# Patient Record
Sex: Female | Born: 1950 | Race: Black or African American | Hispanic: No | Smoking: Former smoker
Health system: Southern US, Community
[De-identification: ages and names within clinical notes are randomized; demographics above are authoritative.]

## PROBLEM LIST (undated history)

## (undated) DIAGNOSIS — C801 Malignant (primary) neoplasm, unspecified: Secondary | ICD-10-CM

## (undated) DIAGNOSIS — U071 COVID-19: Secondary | ICD-10-CM

## (undated) DIAGNOSIS — F419 Anxiety disorder, unspecified: Secondary | ICD-10-CM

## (undated) DIAGNOSIS — F845 Asperger's syndrome: Secondary | ICD-10-CM

## (undated) DIAGNOSIS — C541 Malignant neoplasm of endometrium: Secondary | ICD-10-CM

## (undated) DIAGNOSIS — N644 Mastodynia: Secondary | ICD-10-CM

## (undated) DIAGNOSIS — Z780 Asymptomatic menopausal state: Secondary | ICD-10-CM

## (undated) DIAGNOSIS — N6320 Unspecified lump in the left breast, unspecified quadrant: Secondary | ICD-10-CM

## (undated) DIAGNOSIS — Z01818 Encounter for other preprocedural examination: Secondary | ICD-10-CM

## (undated) DIAGNOSIS — Z1231 Encounter for screening mammogram for malignant neoplasm of breast: Secondary | ICD-10-CM

## (undated) DIAGNOSIS — G8918 Other acute postprocedural pain: Secondary | ICD-10-CM

## (undated) HISTORY — PX: ABDOMINAL HYSTERECTOMY: SHX81

## (undated) HISTORY — PX: KNEE SURGERY: SHX244

---

## 2001-12-19 NOTE — ED Provider Notes (Signed)
[facility]                      EMERGENCY DEPARTMENT TREATMENT REPORT   NAME:  [name], [name]   MR #:         BILLING #: [id number]          DOS: 12/19/2001   TIME: 7:00 A   49-71-31   cc:    PCU COPY          Kandy Garrison, M.D.   Primary Physician:   OBSERVATION UNIT   CHIEF COMPLAINT: Chest discomfort.   HISTORY OF PRESENT ILLNESS: This 51 year old female presents with   complaints of left-sided chest discomfort with onset at 64. The patient   states the pain woke her from sleep. She describes the discomfort as chest   tightness. She still complains of mild pressure. She states she has been   belching a lot and she felt the discomfort was related to indigestion.   REVIEW OF SYSTEMS:   CONSTITUTIONAL: No fevers or chills.   ENT: No congestion.   RESPIRATORY: No shortness of breath.   CARDIOVASCULAR: Left-sided chest discomfort.   GI: No abdominal pain, nausea, or vomiting.   GENITOURINARY:  No urinary complaints. All other systems negative.   PAST MEDICAL HISTORY:  Asthma.   SOCIAL HISTORY:  Denies smoking.  The patient occasionally drinks ETOH.   FAMILY HISTORY:   Positive for heart disease in the patient's grandmother.   PHYSICAL EXAMINATION:   CONSTITUTIONAL:  Well -developed female, awake and alert.   VITAL SIGNS:  Temperature 98.8, heart rate 103, respiratory rate 18, blood   pressure 121/80.   HEENT:  Anicteric sclerae. The nasopharynx and oropharynx are clear.   NECK:  Supple, nontender, symmetrical, no masses or JVD, trachea midline,   thyroid not enlarged, nodular, or tender.   RESPIRATORY:  Clear and equal BS.  No respiratory distress, tachypnea, or   accessory muscle use.   CARDIOVASCULAR:  Heart regular, without murmurs, gallops, rubs, or thrills.   PMI not displaced.   CHEST:  Chest symmetrical without masses or tenderness.   ABDOMEN: Soft and nontender to palpation with no rebound, no mass, or   guarding.   EXTREMITIES: No edema.    SKIN:  Warm and dry.   ASSESSMENT/MANAGEMENT PLAN:  Patient with chest pain.  Acute ischemic   coronary disease must be considered first, and the patient protected   against the consequences of same, while other etiologies (including   infectious, metabolic, pulmonary, GI, and musculoskeletal) are considered.   DIAGNOSTIC STUDIES: EKG was sinus tachycardia with no acute ST changes.   Chest x-ray was not acute changes by my reading. CBC with WBC of 5.8,   hemoglobin 11, hematocrit of 33, 276,000 platelets. BMP was normal. CPK   168, MB 1.3, Troponin 0.1, myoglobin 24.   COURSE IN EMERGENCY DEPARTMENT: On reevaluation the patient states the   abdominal discomfort and the pain that had radiated down to it, was feeling   better. However, she was still complaining of left-sided chest discomfort.   A decision for further evaluation through chest pain protocol was made.   The initial Emergency Department evaluation of this patient appears to be   negative for evidence of an acute coronary ischemia requiring hospital   admission or urgent intervention.  However, ischemic coronary disease has   not been eliminated as a consideration.  Consequently, the patient will be   assigned to the Outpatient  Center for serial cardiac enzymes and, if these   are negative, resting and stress echocardiography or other additional   diagnostic testing.   DISPOSITION: Condition on discharge is stable.   FINAL DIAGNOSIS:   Acute chest pain evaluation.   Electronically Signed By:   Haze Justin, M.D. 12/20/2001 06:37   ____________________________   Haze Justin, M.D.   dd  D:  12/19/2001  T:  12/19/2001  7:20 A   [id number]

## 2001-12-19 NOTE — Procedures (Signed)
[facility]                          STRESS ECHOCARDIOGRAM REPORT   NAME:   [name], April                      SS#:   [id number]   DOB:     06/03/1951                                     AGE:        50   SEX:     F                                           ROOM#:     [id number]   MR#:    [id number]                                       DATE:   12/19/2001   REFERRING PHYS:   L. Siegel                                TAPE/INDEX:   211/3143   PRETEST DATA:   INDICATION:  Chest pain   MEDS TAKEN:  None   MEDS HELD:  None   TARGET HEART RATE:  172     85%:  146   RISK FACTORS:  --   BASELINE ECG:  Normal sinus rhythm; within normal limits   EXERCISE SUPERVISED BY:  Andrea Lubeck, RN-FNP   TEST RESULTS:             BRUCE PROTOCOL    STAGE    SPEED (MPH)   GRADE (%)    TIME (MIN:SEC)     HR       BP   Resting                                                  96      92/45      1          1.7           10            3:00         136     148/80      2          2.5           12            3:00         142     156/74      3          3.4           14            0:14         155     ---/--   Recovery                                 Immediate        --     ---/--                                             2:00          99      90/58                                             4:00          93     112/74                                             6:00          94     110/78   REASON FOR STOPPING:  Fatigue; dyspnea; achieved target HR      TOTAL   EXERCISE TIME:  6:14   ACHIEVED HEART RATE:  155 (90%  max HR)                         EST. METS:   7.3   HR RESPONSE:  Tachycardic                                       PEAK RPP:   24,180   BP RESPONSE:  Normal                                            CHEST PAIN:   None   OBSERVED DYSRHYTHMIAS:  PVC   ST SEGMENT CHANGES:  None                                 WALL MOTION ANALYSIS    LV WALL SEGMENT             PRE-EXERCISE             POST-EXERCISE   Basal Anteroseptal             Normal                 Hyperkinesis   Basal Septal                   Normal                 Hyperkinesis   Basal Inferoseptal             Normal                 Hyperkinesis   Basal Posterior                Normal                 Hyperkinesis   Basal Lateral                    Normal                 Hyperkinesis   Basal Anterior                 Normal                 Hyperkinesis   Mid Anteroseptal               Normal                 Hyperkinesis   Mid Septal                     Normal                 Hyperkinesis   Mid Inferoseptal               Normal                 Hyperkinesis   Mid Posterior                  Normal                 Hyperkinesis   Mid Lateral                    Normal                 Hyperkinesis   Mid Anterior                   Normal                 Hyperkinesis   Apical Septal                  Normal                 Hyperkinesis   Apical Inferior                Normal                 Hyperkinesis   Apical Lateral                 Normal                 Hyperkinesis   Apical Anterior                Normal                 Hyperkinesis   LV  Chamber Size                                        Smaller   ECG INTERPRETATION:  Negative by ECG criteria.   ECHO INTERPRETATION:  --   OVERALL IMPRESSION:  Negative for ischemia.   ECG AND ECHO INTERPRETATION BY :                                          WAYNE OLD, M.D.   bes  D: 12/19/2001  T: 12/19/2001  4:45 P    wks

## 2001-12-19 NOTE — Discharge Summary (Signed)
[facility]                       OUTPATIENT CENTER DISCHARGE SUMMARY   [name], [name]   MR#:          BILLING #: [id number]      DOS: 12/19/2001   DOD:12/19/2001 TIM   P   27-25-36   cc:   Primary Physician:   Date and time of admission:  12/19/01 at 0800.   Date and time of discharge:  12/19/01 at 1130.   HISTORY OF PRESENT ILLNESS:  [age]-year-old female with left-sided   chest discomfort presented to the emergency room at 4 a.m.  She had a   tightness and pressure in her chest, and had a lot of belching and   digestion.   COURSE IN THE OBSERVATION UNIT:  The patient maintained normal vital signs   and sinus rhythm.  There was a stress echocardiogram performed that showed   no evidence of reversible ischemia.  The patient developed a headache that   was partially relieved by Tylenol and ate breakfast without difficulty.   PHYSICAL EXAMINATION:  Obese black female in no distress.   VITAL SIGNS:  Blood pressure 104/70, pulse 89, respirations 18, temperature   98.   HEENT:  Normocephalic, atraumatic.   LUNGS:  Breath sounds are clear bilaterally.   HEART:  Regular rate and rhythm without murmur, rub or gallop.   ABDOMEN:  Soft, nontender.   EXTREMITIES:  Equal pulses.   NEUROLOGIC:  Awake, alert and oriented.   FINAL DIAGNOSIS:  Atypical chest pain.  The patient was sent home in stable   condition to follow up with her primary care physician, Dr. Azucena Kuba.   Electronically Signed By:   Selena Batten, M.D. 12/27/2001 18:59   ____________________________   Selena Batten, M.D.   sp  D:  12/19/2001  T:  12/20/2001 12:27 P   [id number]

## 2001-12-19 NOTE — Procedures (Signed)
[facility]                          STRESS ECHOCARDIOGRAM REPORT   NAME:   [name], [name]                      SS#:   [id number]   DOB:     07-07-1950                                     AGE:        50   SEX:     F                                           ROOM#:     [id number]   MR#:    [id number]                                       DATE:   12/19/2001   REFERRING PHYSMarland Kitchen   Sundra Aland                                TAPE/INDEX:   211/3143   PRETEST DATA:   INDICATION:  Chest pain   MEDS TAKEN:  None   MEDS HELD:  None   TARGET HEART RATE:  172     85%:  146   RISK FACTORS:  --   BASELINE ECG:  Normal sinus rhythm; within normal limits   EXERCISE SUPERVISED BY:  Dara Lords, RN-FNP   TEST RESULTS:             BRUCE PROTOCOL    STAGE    SPEED (MPH)   GRADE (%)    TIME (MIN:SEC)     HR       BP   Resting                                                  96      92/45      1          1.7           10            3:00         136     148/80      2          2.5           12            3:00         142     156/74      3          3.4           14            0:14         155     ---/--   Recovery  Immediate        --     ---/--                                             2:00          99      90/58                                             4:00          93     112/74                                             6:00          94     110/78   REASON FOR STOPPING:  Fatigue; dyspnea; achieved target HR      TOTAL   EXERCISE TIME:  6:14   ACHIEVED HEART RATE:  155 (90%  max HR)                         EST. METS:   7.3   HR RESPONSE:  Tachycardic                                       PEAK RPP:   24,180   BP RESPONSE:  Normal                                            CHEST PAIN:   None   OBSERVED DYSRHYTHMIAS:  PVC   ST SEGMENT CHANGES:  None                                 WALL MOTION ANALYSIS    LV WALL SEGMENT             PRE-EXERCISE             POST-EXERCISE   Basal Anteroseptal             Normal                 Hyperkinesis   Basal Septal                   Normal                 Hyperkinesis   Basal Inferoseptal             Normal                 Hyperkinesis   Basal Posterior                Normal                 Hyperkinesis   Basal Lateral  Normal                 Hyperkinesis   Basal Anterior                 Normal                 Hyperkinesis   Mid Anteroseptal               Normal                 Hyperkinesis   Mid Septal                     Normal                 Hyperkinesis   Mid Inferoseptal               Normal                 Hyperkinesis   Mid Posterior                  Normal                 Hyperkinesis   Mid Lateral                    Normal                 Hyperkinesis   Mid Anterior                   Normal                 Hyperkinesis   Apical Septal                  Normal                 Hyperkinesis   Apical Inferior                Normal                 Hyperkinesis   Apical Lateral                 Normal                 Hyperkinesis   Apical Anterior                Normal                 Hyperkinesis   LV  Chamber Size                                        Smaller   ECG INTERPRETATION:  Negative by ECG criteria.   ECHO INTERPRETATION:  --   OVERALL IMPRESSION:  Negative for ischemia.   ECG AND ECHO INTERPRETATION BY :                                          Deniece Portela OLD, M.D.   Ayesha Rumpf  D: 12/19/2001  T: 12/19/2001  4:45 P    wks

## 2003-01-29 NOTE — ED Provider Notes (Signed)
[facility]                      EMERGENCY DEPARTMENT TREATMENT REPORT   NAME:  CALENA, SALEM            PT. LOCATION:     ER  [phone]   MR #:         BILLING #: [id number]          DOS: 01/29/2003   TIME: 4:23 A   56-21-30   cc:    Kandy Garrison, M.D.   Primary Physician:   Kandy Garrison, M.D.   TIME:   0335 hours.   CHIEF COMPLAINT:   Bladder.   HISTORY OF PRESENT ILLNESS:   A 52 year old female who comes to the   emergency department for evaluation of urinary symptoms that have been   increased today.  States she is having a lot of frequency and urgency.  She   has been complaining that her urine seems darker than it normally is.   There is no dark brown or tea color color to it.  Has not seen any bright   red bleeding.  Notes that she has had problems like this before but usually   during her menstrual cycle than for it to happen to today is abnormal for   her.  She denies any dysuria.  Denies any vaginal discharge.  Denies   abdominal pain, nausea or vomiting.  Reports she has been having headaches   on and off but does not have one now.  Her appetite has been normal. She   has had no abnormal bowel habits.   REVIEW OF SYSTEMS:   CONSTITUTIONAL:  No fevers or chills.   EYES: No visual symptoms.   ENT: No sore throat, runny nose or other URI symptoms.   GASTROINTESTINAL:  No vomiting, diarrhea, or abdominal pain.   GENITOURINARY:  As noted in HPI.   MUSCULOSKELETAL:  No joint pain or swelling.   INTEGUMENTARY:  No rashes.   NEUROLOGICAL:  No headaches, sensory or motor symptoms.   PAST MEDICAL HISTORY:  None.   SOCIAL HISTORY: Here alone.   MEDICATIONS:  None.   ALLERGIES:  Penicillin, sulfa.   PHYSICAL EXAMINATION:   GENERAL APPEARANCE:  The patient appears well developed and well nourished.   Appearance   and behavior are age and situation appropriate.   VITAL SIGNS:  Blood pressure 123/84, pulse 115, respirations 20,   temperature 99.3. 3/10 discomfort.    HEENT:   Eyes:  Conjunctivae clear, lids normal.  Pupils equal,   symmetrical, and normally reactive.   Mouth/Throat:  Surfaces of the   pharynx, palate, and tongue are pink, moist, and without lesions.   NECK:  Supple, nontender, symmetrical.   LYMPHATIC:  No cervical or submandibular lymphadenopathy palpated.   RESPIRATORY:  Clear and equal breath sounds.  No respiratory distress,   tachypnea, or accessory muscle use.   CARDIOVASCULAR:   Heart is regular without murmurs, gallops, rubs or   thrills.   GI:  Abdomen soft, nontender, without complaint of pain to palpation.  No   hepatomegaly or splenomegaly.   MUSCULOSKELETAL:   Nail beds pink with prompt capillary refill.   SKIN:  Warm and dry without rashes.   CONTINUATION BY ERIN ICENBICE, PA-C:   INITIAL ASSESSMENT AND MANAGEMENT PLAN:  A 52 year old female with urinary   frequency and urgency.  At this time we will check a urine dip  and blood   glucose.  Urine dip was negative for nitrates and leukocytes and positive   for trace blood.  Blood glucose is 119.   CLINICAL IMPRESSION:  Acute cystitis.   DISPOSITION/PLAN:  The patient discharged home on Cipro 500 mg twice daily   empirically for urinary tract infection.  A urine culture was sent.  She is   to return should she develop abdominal pain, fevers, or other concerns.   Follow up with Dr. Azucena Kuba if not improving.  The patient is discharged home   in stable condition, with instructions to follow up with their regular   doctor.  They are advised to return immediately for any worsening or   symptoms of concern.   Electronically Signed By:   Selena Batten, M.D. 02/05/2003 08:17   ____________________________   Selena Batten, M.D.   gm/dh(1st part)  D:  01/29/2003  T:  01/30/2003  8:44 P   [id number]   ERIN ICENBICE, PA-C

## 2003-02-21 NOTE — ED Provider Notes (Signed)
[facility]                      EMERGENCY DEPARTMENT TREATMENT REPORT   NAME:  [name], [name]            PT. LOCATION:     ER  [phone]   MR #:         BILLING #: [id number]          DOS: 02/21/2003   TIME: 9:15 A   49-71-31   cc:   Primary Physician:   CHIEF COMPLAINT:   Headache.   HISTORY OF PRESENT ILLNESS:  This 52 year old female states she woke up   with a severe headache this morning.  She reports that she checked her   blood pressure and it was elevated with elevated diastolic pressures   consistently.  She denies previous history of hypertension, however, the   blood pressure readings were erratic with systolic blood pressures that   were minimally over the diastolic pressure, such as 120/110.  The patient   describes it as a pressure sensation, diffusely located throughout her   head.  Also complaining of neck tightness on her left side.  Denies any   posterior neck stiffness.  The patient states this is the "Worst headache   of her life."  The patient complaining of nausea.  Denies any vomiting.   Denies any fevers or chills.  Denies any visual difficulty.   REVIEW OF SYSTEMS:   CONSTITUTIONAL:  No fever, chills, weight loss.   EYES: No visual symptoms.   ENT: No sore throat, runny nose or other URI symptoms.   RESPIRATORY:  No cough, shortness of breath, or wheezing.   CARDIOVASCULAR:  No chest pain, chest pressure, or palpitations.   GI:  Positive nausea, no vomiting.   NEUROLOGICAL:  Severe headache.   All other systems negative.   PAST MEDICAL HISTORY:  Negative.   MEDICATIONS:  No current medications.   ALLERGIES:  Sulfa, aspirin, Penicillin.   SOCIAL HISTORY: Denies smoking.  The patient occasionally drinks ETOH.   PHYSICAL EXAMINATION:   GENERAL APPEARANCE: Well-developed female, awake, alert.   VITAL SIGNS: Temperature 98.3, heart rate 118, respirations 20, blood   pressure 140/88.   HEENT:  Anicteric sclera.  Pupils PERRL.  Extraocular movements are  intact.   Fundi benign.  Nasopharynx unremarkable.  Mouth/Throat:  Surfaces of the   pharynx, palate, and tongue are pink, moist, and without lesions.   NECK:  Supple, nontender, symmetrical, no masses or JVD, trachea midline,   thyroid not enlarged, nodular, or tender. Tenderness left, lateral,   cervical musculature.   RESPIRATORY:  Clear and equal breath sounds.  No respiratory distress,   tachypnea, or accessory muscle use.   CARDIOVASCULAR:  Heart regular, without murmurs, gallops, rubs, or thrills.   PMI not displaced.   CHEST:  Chest symmetrical without masses or tenderness.   ABDOMEN:  Soft, nontender.   EXTREMITIES:  Unremarkable.   SKIN:  Warm and dry.   NEUROLOGICAL:  Alert, oriented, grossly nonfocal examination.  Cranial   nerves, deep tendon reflexes, strength, and light touch sensation are   unremarkable.   IMPRESSION AND MANAGEMENT PLAN:  Acute, severe cephalgia.  The patient   stating this is the worst headache of her life, however, headache is   improving at the time of evaluation in the emergency department.  Will   obtain head CT to rule out intracranial abnormality.   CONTINUATION BY DR.  SIEGEL:   The patient observed in the emergency department, she states her headache   is completely resolved.   DIAGNOSTIC STUDIES OBTAINED:  CT scan of the head negative per radiology.   On reevaluation after the head CT the patient still states that her   headache is completely resolved.  She does complain of some muscle   tightness in the left lateral neck and trapezius musculature.   PLAN:  Fioricet and Robaxin as needed.  Return if worsening symptoms or   further problems.  Follow up with Dr. Forestine Na in 1 or 2 days for   recheck and return if any worsening.   CONDITION ON DISCHARGE:  Stable.   FINAL DIAGNOSIS:  Acute severe cephalgia, resolved.   Electronically Signed By:   Haze Justin, M.D. 02/24/2003 07:39   ____________________________   Haze Justin, M.D.    sl/jh  D:  02/21/2003  T:  02/22/2003  8:35 A   [id number]

## 2003-12-29 NOTE — ED Provider Notes (Signed)
[facility]                      EMERGENCY DEPARTMENT TREATMENT REPORT   OBSERVATION UNIT   NAME:  [name]            PT. LOCATION:     ER  [phone]   MR #:         BILLING #: [id number]          DOS: 12/29/2003   TIME: 8:22 P   56-21-30   cc:    Kandy Garrison, M.D.   Primary Physician:   Kandy Garrison, M.D.   The patient was evaluated at 2012 hours.   CHIEF COMPLAINT:   Chest pressure, neck pain.   HISTORY OF PRESENT ILLNESS:  The patient is a 53 year old female who   presents with discomfort in the left neck and a pressure discomfort across   her chest all day today.  It did seem to worsen if she got up and went up   and down some steps as she became more fatigued than normal.  She denies   shortness of breath.  She has had no nausea or vomiting but did have some   diaphoresis which is unusual for her.  She had a similar episode 06-03 and   at that time came here for a stress echocardiogram which apparently was   read as negative for ischemia.  She states she has been under a lot of   stress recently but no other illnesses.  She rates the pressure at 5/10.   REVIEW OF SYSTEMS:   CONSTITUTIONAL:  No fever, chills, weight loss.   EYES: No visual symptoms.   ENT: No sore throat, runny nose or other URI symptoms.   ALLERGIC/IMMUNOLOGIC:  No urticaria or allergy symptoms.   RESPIRATORY:  No cough, shortness of breath, or wheezing.   CARDIOVASCULAR:  Chest pressure today, worsened with exertion.  She has had   some diaphoresis but no dizziness, shortness of breath, nausea or vomiting.   GASTROINTESTINAL:  No vomiting, diarrhea, or abdominal pain.   GENITOURINARY:  No dysuria, frequency, or urgency.   Denies complaints in any other system.   PAST MEDICAL HISTORY:   No chronic illnesses.   SOCIAL HISTORY:  Nonsmoker.   FAMILY HISTORY:   Maternal grandmother had diabetes and heart problems and   her mother had lung problems and heart problems.    ALLERGIES:  Sulfa, Penicillin, aspirin.   MEDICATIONS:  None.   PHYSICAL EXAMINATION:   VITAL SIGNS:  Blood pressure 142/87, pulse 119, respirations 20,   temperature 100.5.  O2 saturation 100% on room air.   GENERAL APPEARANCE:  The patient appears well developed and well nourished.   Appearance and behavior are age and situation appropriate.   HEENT:   Mouth/Throat:  Surfaces of the pharynx, palate, and tongue are   pink, moist, and without lesions.   NECK:  Supple, nontender, symmetrical, no masses or JVD, trachea midline,   thyroid not enlarged, nodular, or tender.   RESPIRATORY:  Clear and equal breath sounds.  No respiratory distress,   tachypnea, or accessory muscle use.   CARDIOVASCULAR:  Rapid, regular.  No MGR.   GI:  Abdomen soft, nontender, without complaint of pain to palpation.  No   hepatomegaly or splenomegaly.   NEUROLOGICAL:  She is awake, alert, aware of surroundings.  Shows no focal   deficits.   LOWER EXTREMITIES:   Calf  areas are soft and nontender.  No edema noted.   CONTINUATION BY DR. Henrene Hawking:   IMPRESSION:  A patient with chest pain.  Acute ischemic coronary disease   must be considered first, and the patient protected against the   consequences of same, while other etiologies (including infectious,   metabolic, pulmonary, gastrointestinal, and musculoskeletal) are   considered.   DIAGNOSTIC TESTING:  CBC normal except for mild anemia with hemoglobin of   10, hematocrit of 32, and 349,000 platelets.  CMP was unremarkable.  Lipase   was normal.  Cardiac enzymes were normal except for total CPK is elevated   at 234, but MB is normal.  Urine dip is negative x 3.   COURSE IN THE EMERGENCY DEPARTMENT:  Discussed further evaluation with the   patient, and she is agreeable for further evaluation through chest pain   protocol.   PLAN:   The initial emergency department evaluation of this patient appears   to be negative for evidence of an acute coronary ischemia requiring    hospital admission or urgent intervention.  However, ischemic coronary   disease has not been eliminated as a consideration.  Consequently, the   patient will be assigned to the Emergency Department Observation Unit for   serial cardiac enzymes and, if these are negative, resting and stress   echocardiography or other additional diagnostic testing.   CONDITION ON ASSIGNMENT TO ED OBSERVATION UNIT:  Stable.   FINAL DIAGNOSIS:  Acute precordial pain.   Electronically Signed By:   Haze Justin, M.D. 01/12/2004 23:11   ____________________________   Haze Justin, M.D.   jj/gm   D:  12/29/2003  T:  12/29/2003  9:01 P   [id number](edit)   Claris Pong, PA

## 2003-12-30 NOTE — Procedures (Signed)
[facility]                          STRESS ECHOCARDIOGRAM REPORT   NAME:   [name], [name]                     SS#:   [id number]   DOB:     1951/05/27                                     AGE:        53   SEX:     F                                           ROOM#:     [id number]   MR#:    [id number]                                       DATE:   12/30/2003   REFERRING PHYSSundra Aland                                TAPE/INDEX:   285/2791   PRETEST DATA:   INDICATION:  Chest pain   MEDS TAKEN:  None   MEDS HELD:  None   TARGET HEART RATE:  169     85%:  143   RISK FACTORS:  Ex-smoker   BASELINE ECG:  --   EXERCISE SUPERVISED BY:  --   TEST RESULTS:             BRUCE PROTOCOL    STAGE    SPEED (MPH)   GRADE (%)    TIME (MIN:SEC)     HR       BP   Resting                                                 100     115/79      1          1.7           10            3:00         131     128/80      2          2.5           12            1:30         151     ---/--   Recovery                               Immediate       151     ---/--  2:00         108     111/71                                             4:00          96     117/72                                             6:00          96     106/74   REASON FOR STOPPING: Achieved target heart rate              TOTAL EXERCISE   TIME:  4:30   ACHIEVED HEART RATE:  151 (89%  max HR)                      EST. METS:   --   HR RESPONSE:  Tachycardic                                    PEAK RPP:   19,328   BP RESPONSE:  Normal                                         CHEST PAIN:   None   OBSERVED DYSRHYTHMIAS:  --   ST SEGMENT CHANGES:  --                                 WALL MOTION ANALYSIS   LV WALL SEGMENT             PRE-EXERCISE             POST-EXERCISE   Basal Anteroseptal             Normal                 Hyperkinesis    Basal Septal                   Normal                 Hyperkinesis   Basal Inferoseptal             Normal                 Hyperkinesis   Basal Posterior                Normal                 Hyperkinesis   Basal Lateral                  Normal                 Hyperkinesis   Basal Anterior                 Normal                 Hyperkinesis  Mid Anteroseptal               Normal                 Hyperkinesis   Mid Septal                     Normal                 Hyperkinesis   Mid Inferoseptal               Normal                 Hyperkinesis   Mid Posterior                  Normal                 Hyperkinesis   Mid Lateral                    Normal                 Hyperkinesis   Mid Anterior                   Normal                 Hyperkinesis   Apical Septal                  Normal                 Hyperkinesis   Apical Inferior                Normal                 Hyperkinesis   Apical Lateral                 Normal                 Hyperkinesis   Apical Anterior                Normal                 Hyperkinesis   LV  Chamber Size                                        Smaller   ECG INTERPRETATION:  Negative by ECG criteria.   ECHO INTERPRETATION:  Normal.   OVERALL IMPRESSION:  Negative for ischemia.  Low likelihood for coronary   artery disease.   ECG AND ECHO INTERPRETATION BY :                                          RON MCKECHNIE, M.D.   [name]  D: 12/30/2003  T: 12/31/2003  6:15 P    wks

## 2003-12-30 NOTE — Discharge Summary (Signed)
[facility]                       OUTPATIENT CENTER DISCHARGE SUMMARY   [name], [name]   MR#:          BILLING #: [id number]      DOS: 12/29/2003   DOD:12/30/2003 TIM   91-47-82   cc:    Docia Furl, M.D.          Kandy Garrison, M.D.   Primary Physician:   Date and Time of Assignment:  12/29/03 at 2230.   Date and Time of Disposition:  12/30/03 at 1030.   ASSIGNMENT DIAGNOSIS:  Acute precordial pain.   DISPOSITION DIAGNOSES:      1. Precordial pain, cardiac unlikely.      2. History of headaches.   HISTORY:  This is a 53 year old female.  The patient had complained of   chest pressure since early that morning with left-sided neck pain.  The   Emergency Department evaluation was unrevealing and the patient was   assigned to the outpatient center for serial cardiac enzymes and stress   echocardiography.   COURSE IN THE OBSERVATION UNIT:  The patient's vital signs remained stable.   She denied continued pain. The patient was assigned to the Emergency   Department Observation Unit for serial cardiac enzymes and resting and   stress echocardiography.  Results of serum myoglobin at 0 and 3 hours, and   serum CPK and MB at 0, 6, and 9 hours gave no indication of acute   myocardial damage.  The patient then underwent resting and exercise 2-D   echocardiography and exercise test under the supervision of [facility].  Their final impression was of "No CAD, OK for Discharge."   EXAMINATION AT DISPOSITION: CARDIOVASCULAR:  S1 and S2 are heard with a   regular rate and rhythm without murmurs, gallops, or rubs.   LUNGS:  Clear to auscultation bilaterally.   ABDOMEN:  Remains soft at disposition.   The patient was instructed to follow up with her primary care physician.  I   told her to return to the emergency room immediately if she developed any   chest pain, shortness of breath, left-sided arm weakness, jaw pain, nausea,    vomiting, or diaphoresis.  The probabilistic nature of cardiac   diagnostic testing was explained.  The patient was counseled to seek   further cardiac evaluation should symptoms worsen or persist without   another diagnosis being found.   Electronically Signed By:   Truitt Merle, M.D. 12/31/2003 06:49   ____________________________   Truitt Merle, M.D.   Mauri Reading  D:  12/30/2003  T:  12/30/2003  4:11 P   [id number]   Penelope Coop, PA-C

## 2003-12-30 NOTE — Procedures (Signed)
[facility]                          STRESS ECHOCARDIOGRAM REPORT   NAME:   [name], [name]                     SS#:   [id number]   DOB:     09/15/1950                                     AGE:        53   SEX:     F                                           ROOM#:     [id number]   MR#:    [id number]                                       DATE:   12/30/2003   REFERRING PHYS:   L. Siegel                                TAPE/INDEX:   285/2791   PRETEST DATA:   INDICATION:  Chest pain   MEDS TAKEN:  None   MEDS HELD:  None   TARGET HEART RATE:  169     85%:  143   RISK FACTORS:  Ex-smoker   BASELINE ECG:  --   EXERCISE SUPERVISED BY:  --   TEST RESULTS:             BRUCE PROTOCOL    STAGE    SPEED (MPH)   GRADE (%)    TIME (MIN:SEC)     HR       BP   Resting                                                 100     115/79      1          1.7           10            3:00         131     128/80      2          2.5           12            1:30         151     ---/--   Recovery                               Immediate       151     ---/--                                               2:00         108     111/71                                             4:00          96     117/72                                             6:00          96     106/74   REASON FOR STOPPING: Achieved target heart rate              TOTAL EXERCISE   TIME:  4:30   ACHIEVED HEART RATE:  151 (89%  max HR)                      EST. METS:   --   HR RESPONSE:  Tachycardic                                    PEAK RPP:   19,328   BP RESPONSE:  Normal                                         CHEST PAIN:   None   OBSERVED DYSRHYTHMIAS:  --   ST SEGMENT CHANGES:  --                                 WALL MOTION ANALYSIS   LV WALL SEGMENT             PRE-EXERCISE             POST-EXERCISE   Basal Anteroseptal             Normal                 Hyperkinesis    Basal Septal                   Normal                 Hyperkinesis   Basal Inferoseptal             Normal                 Hyperkinesis   Basal Posterior                Normal                 Hyperkinesis   Basal Lateral                  Normal                 Hyperkinesis   Basal Anterior                 Normal                 Hyperkinesis     Mid Anteroseptal               Normal                 Hyperkinesis   Mid Septal                     Normal                 Hyperkinesis   Mid Inferoseptal               Normal                 Hyperkinesis   Mid Posterior                  Normal                 Hyperkinesis   Mid Lateral                    Normal                 Hyperkinesis   Mid Anterior                   Normal                 Hyperkinesis   Apical Septal                  Normal                 Hyperkinesis   Apical Inferior                Normal                 Hyperkinesis   Apical Lateral                 Normal                 Hyperkinesis   Apical Anterior                Normal                 Hyperkinesis   LV  Chamber Size                                        Smaller   ECG INTERPRETATION:  Negative by ECG criteria.   ECHO INTERPRETATION:  Normal.   OVERALL IMPRESSION:  Negative for ischemia.  Low likelihood for coronary   artery disease.   ECG AND ECHO INTERPRETATION BY :                                          RON MCKECHNIE, M.D.   bes  D: 12/30/2003  T: 12/31/2003  6:15 P    wks

## 2004-01-01 NOTE — ED Provider Notes (Signed)
[facility]                      EMERGENCY DEPARTMENT TREATMENT REPORT   NAME:  ELINORE, SHULTS            PT. LOCATION:     ER  [phone]   MR #:         BILLING #: [id number]          DOS: 01/01/2004   TIME: 4:41 A   [id number]   cc:   Primary Physician:   Time seen:  0441 hours.   CHIEF COMPLAINT:  Upper abdominal pain, extremity tingling and numbness.   HISTORY OF PRESENT ILLNESS;  This is a 53 year old female who has had   epigastric pain associated with burping tonight.  Also she has had tingling   in her left arm and heaviness in her left leg that has since improved.  The   patient has been under a lot of stress at home.  She was seen here for   chest pain protocol on 12/29/03 through 12/30/03 of this year.  Stress test was   negative.   REVIEW OF SYSTEMS:   GI:  As noted.   NEUROLOGICAL:  As noted.   PSYCHIATRIC:  As noted.   Denies complaints in any other system.   PAST MEDICAL HISTORY:  Negative except for recent chest pain evaluation and   normal stress test.   SOCIAL HISTORY: The patient is a nonsmoker.  Denies alcohol or drug abuse.   FAMILY HISTORY:  Remarkable for diabetes mellitus.   PHYSICAL EXAMINATION:  The patient is alert and oriented x 3, cooperative.   VITAL SIGNS:  Blood pressure 127/82, pulse 102, respirations 20,   temperature 98.7, O2 saturation 100% on room air.   HEENT:  Eyes:  Conjunctivae clear, lids normal.  Pupils equal, symmetrical,   and normally reactive.    Mouth/Throat:  Surfaces of the pharynx, palate,   and tongue are pink, moist, and without lesions.   NECK:  Supple, nontender, symmetrical, no masses or JVD, trachea midline,   thyroid not enlarged, nodular, or tender.   RESPIRATORY:  Clear and equal breath sounds.  No respiratory distress,   tachypnea, or accessory muscle use.   GI:  Abdomen soft, nontender, without complaint of pain to palpation.  No   hepatomegaly or splenomegaly.    MUSCULOSKELETAL:  Nails:  No clubbing or deformities.  Nail beds pink with   prompt capillary refill.   SKIN:  Warm and dry without rashes.   NEUROLOGIC:  Cranial nerves, deep tendon reflexes, strength, and light   touch sensation are unremarkable.  Cerebellar function is intact with good   finger to nose.   INITIAL ASSESSMENT AND MANAGEMENT PLAN:  This is a 53 year old female with   abdominal discomfort and gassiness as well as peripheral tingling.  No   focal findings on neurological examination.   DIAGNOSTIC EVALUATION:  I-stat was remarkable for anemia, hemoglobin and   hematocrit of 10.9 and 32 respectively.  Potassium was low at 3.2.  The   rest of the electrolytes were unremarkable.   COURSE IN THE EMERGENCY DEPARTMENT:  The patient was given a GI cocktail   with improvement in her symptoms.  She was subsequently discharged home   with instructions to use Gas-X.  She was given a prescription for potassium   to be taken as directed.  She is to be reevaluated for worsening of   symptoms.  Otherwise follow up with primary care physician.   FINAL DIAGNOSES:      1. Acute epigastric abdominal pain evaluation.      2. Acute hypokalemia.      3. Anemia.   DISPOSITION:  The patient was discharged home in stable and improved   condition.   Electronically Signed By:   Erlinda Hong, M.D. 01/12/2004 17:03   ____________________________   TODD Wilfrid Lund, M.D.   sp  D:  01/01/2004  T:  01/02/2004  2:37 P   [id number]

## 2004-11-11 NOTE — ED Provider Notes (Signed)
[facility] [place]                      EMERGENCY DEPARTMENT TREATMENT REPORT   NAME:  [name], [name]            PT. LOCATION:     ER  ZO10   MR #:         BILLING #: [id number]          DOS: 11/11/2004   TIME: 3:34 A   [id number]   cc:   Primary Physician:   None   CHIEF COMPLAINT:  Dental pain.   HISTORY OF PRESENT ILLNESS:  A 54 year old female presents with pain.  All   of her teeth are bad, and she is actually supposed to be seeing her dentist   tomorrow for pulling of the teeth, but she states that her teeth are   hurting worse than usual and she is worried they are infected.  Her gums   are a little swollen.  She denies fevers or chills.   REVIEW OF SYSTEMS:   CONSTITUTIONAL:  No fevers or chills.   ENT:  Dental pain.   PAST MEDICAL HISTORY:  Unremarkable.   SOCIAL HISTORY:  Here with son.   ALLERGIES:  penicillin and aspirin.   MEDICATIONS:  Tylenol #3.   PHYSICAL EXAMINATION:   VITAL SIGNS:  Blood pressure 124/80, pulse 103, respirations 20.   Temperature 98.8.  Oxygen saturation 100%.  Pain is 10 out of 10.   GENERAL APPEARANCE:  The patient appears well developed and well nourished.   Appearance and behavior are age and situation appropriate.   Eyes:  Conjunctivae clear, lids normal.  Pupils equal, symmetrical, and   normally reactive.   ENT:  The patient has multiple dental caries, multiple areas of breakdown   of the teeth where the teeth have turned black.  In her right upper gum   line she has some swelling of the gums.  Diffuse tenderness across all   remaining teeth.  No evidence of an abscess.   NECK:  Supple, symmetrical.  Trachea midline.   LYMPHATIC:  No cervical or submandibular lymphadenopathy palpated.   INITIAL ASSESSMENT AND MANAGEMENT PLAN:  A [age]-year-old female   presents with multiple dental caries and dental pain.  The patient would   like a prescription for antibiotics.  She has taken clindamycin in the past    without difficulty.  As she is penicillin allergic, we will prescribe her   clindamycin and she will continue Tylenol and ibuprofen.   DIAGNOSIS:  Facial pain secondary to dental caries.   PLAN:   1.  The patient is discharged home in stable condition, with instructions   to follow up with their regular doctor.  They are advised to return   immediately for any worsening or symptoms of concern.  The patient is to   keep her appointment with her dentist for later today.   2.  The patient is written prescription for clindamycin #40.  She is   advised to swish warm salt water a couple of times daily.  She may return   if fever, new or worsening symptoms.  Continue Tylenol and ibuprofen for   pain.   Electronically Signed By:   Lucita Ferrara, M.D. 11/30/2004 00:47   ____________________________   Lucita Ferrara, M.D.   hp  D:  11/11/2004  T:  11/11/2004  4:27 P   [id number]   Salem Caster, PA-C

## 2005-02-16 NOTE — ED Provider Notes (Signed)
[facility]                      EMERGENCY DEPARTMENT TREATMENT REPORT   NAME:  [name], [name]            PT. LOCATION:     ER  ER09   MR #:         BILLING #: [id number]          DOS: 02/16/2005   TIME:   40-98-11   cc:   Primary Physician:   CHIEF COMPLAINT:  Left lower leg swelling.   HISTORY OF PRESENT ILLNESS:  This 54 year old female had some lower   extremity discomfort bilaterally then noted increased left lower extremity   swelling starting yesterday.  She was concerned about a possible blood   clot.  She denies injury.  She is otherwise up, ambulatory, and a   reasonably active person.  The patient is also complaining of some cough,   cold, congestion, and some sinus discomfort.  She has had a lot of illness   around her home.  She has noted a low-grade fever from that.   REVIEW OF SYSTEMS:   CONSTITUTIONAL:  A mild low-grade fever.   EYES:   No visual symptoms.   ENT:  Some cough, cold, congestion, and sinus congestion.   ENDOCRINE:  No diabetic symptoms.   HEMATOLOGIC/LYMPHATIC:   No excessive bruising or lymph node swelling.   ALLERGIC/IMMUNOLOGIC:  No urticaria or allergy symptoms.   RESPIRATORY:  A mild cough.   CARDIOVASCULAR:  No chest pain, chest pressure, or palpitations.   GASTROINTESTINAL:  No vomiting, diarrhea, or abdominal pain.   GENITOURINARY:  No dysuria, frequency, or urgency.   MUSCULOSKELETAL:  Bilateral lower extremity discomfort as well as the left   lower extremity swelling.   INTEGUMENTARY:  No rashes.   NEUROLOGICAL:  No headaches, sensory or motor symptoms.   PSYCHIATRIC:  No suicidal or homicidal ideation.   PAST MEDICAL HISTORY:  Foot surgery, but otherwise no significant   complaint.   PAST SURGICAL HISTORY:  Tonsillectomy and right knee.   SOCIAL HISTORY:  Nonsmoker.   ALLERGIES:  Penicillin, aspirin, and sulfa.   MEDICATIONS:  Tylenol.   PHYSICAL EXAMINATION:   GENERAL:  The patient is alert, oriented x 3, and in no apparent  distress.   VITAL SIGNS:  Blood pressure is 129/75, pulse 111, respiratory rate 22, and   temperature 99.6.   GENERAL APPEARANCE:  Patient appears well developed and well nourished.   Appearance and behavior are age and situation appropriate.   Eyes:  Conjunctivae clear, lids normal.  Pupils equal, symmetrical, and   normally reactive.  Ears/Nose:  Hearing is grossly intact to voice.   Internal and external examinations of the ears and nose are unremarkable.   Throat has some mild congestion.   RESPIRATORY:  Clear and equal breath sounds.  No respiratory distress,   tachypnea, or accessory muscle use.   CARDIOVASCULAR:   Heart regular, without murmurs, gallops, rubs, or   thrills.  PMI not displaced.   GI:  Abdomen soft, nontender, without complaint of pain to palpation.  No   hepatomegaly or splenomegaly.  No abdominal or inguinal masses appreciated   by inspection or palpation.   MUSCULOSKELETAL:  She complains of some mild tenderness in her left lower   extremity, but no significant erythema or discharge is appreciated.  A 2+   dorsalis pedis pulse.   SKIN:  Warm  and dry without rashes.   NEUROLOGIC:  Cranial nerves, deep tendon reflexes, strength, and light   touch sensation are unremarkable.  Judgment appears appropriate.  Recent   and remote memory appear to be intact.   PSYCHIATRIC:  Oriented to time, place and person.  Mood and affect   appropriate.   INITIAL ASSESSMENT AND PLAN:  We will do a PVL of the left lower extremity   to rule out DVT versus other musculoskeletal etiology.   CONTINUATION BY DR. Barry Dienes:   EMERGENCY DEPARTMENT COURSE:  The patient had a PVL obtained and then, oh   by the way, requested some antibiotics and pain medications for her dental   pain.  She is following up with the [facility] regarding some   dental assistance but requested some antibiotics for that.  The patient was   noted to have some dental caries throughout.    DIAGNOSTICS:  A PVL of the left lower extremity showed negative for DVT and   reflux in left lower extremity and right common femoral vein of the vessels   assessed.   DIAGNOSES:      1. Left lower extremity pain, mild swelling.      2. Dental caries and dental pain.   DISPOSITION:  The patient is being discharged home.  Instructed to keep her   leg elevated, ice,   follow up with primary care Arwin Bisceglia Dr. Azucena Kuba on Monday.  Return if   worsening or further concerns.  Also, was given a prescription of Naprosyn   and clindamycin.   Electronically Signed By:   Lawrence Marseilles, M.D. 02/20/2005 16:08   ____________________________   Lawrence Marseilles, M.D.   st  D:  02/16/2005  T:  02/17/2005 10:33 P   [id number]   749   sb1  D:  02/16/2005  T:  02/17/2005 11:00 A   [id number]

## 2005-02-17 NOTE — Discharge Summary (Signed)
[facility]                       OUTPATIENT [name] DISCHARGE SUMMARY   NAME:[name]   MR#:          BILLING #: [id number]      DOS: 02/17/2005   DOD:02/17/2005 TIM   [id number]   cc:    Kandy Garrison, M.D.   Primary Physician:   DATE AND TIME OF ASSIGNMENT:   02/17/05 at 0410.   DATE AND TIME OF DISCHARGE:   02/17/05 at 1355.   ASSIGNMENT DIAGNOSIS:  Precordial pain.   DISPOSITION DIAGNOSIS:  Precordial pain, cardiac unlikely.   HISTORY:  A 54 year old female who presented to the emergency department   last evening with chest pain that began while she was trying to go to   sleep.  She states that she had some left lower extremity pain and swelling   with a negative PVL.  Due to that pain, she decided she was going to lay   down in bed early last evening.  She ate dinner and then preceded to lay   down in bed, when she noticed a nagging soreness in her left precordial   region.    She had a negative stress echocardiogram here one year ago.  Is   a nonsmoker, although she has a remote history of smoking and has negative   family history for cardiac disease.  The emergency department evaluation   was unrevealing and the patient was assigned to the outpatient [name] for   serial cardiac enzymes and stress echocardiography.   COURSE IN THE OBSERVATION UNIT:  She has continued to have pain in her left   precordial region which is worse with movement, and does not seem to bother   her when she is not moving. D-dimer returned at 1.66.  She subsequently had   a CT scan of the chest, which was read as no evidence of pulmonary embolism   or other acute abnormality by Dr. Darlina Rumpf.  She was also given a GI   cocktail without resolution of her symptoms.  She then had an exercise   stress test by [facility], where she achieved 90% of   her maximum heart rate and it was deemed to be an adequate study.  They    determined normal left ventricular function with low likelihood of coronary   artery disease.  Additionally, serial cardiac enzymes, myoglobin and   troponin showed no indication of acute myocardial damage.  With these   results, CVAL final impression was of noncardiac chest pain with no   coronary artery disease, no MI, no unstable angina.   PHYSICAL EXAMINATION:  At disposition.   VITAL SIGNS:  Blood pressure 110/70, pulse 80, respirations 20.   GENERAL APPEARANCE:  The patient appears well developed and well nourished.   Appearance and behavior are age and situation appropriate.   No acute   distress.   HEENT:   Eyes:  Conjunctivae clear, lids normal.  Pupils equal,   symmetrical, and normally reactive.   ENT:  Hearing is grossly intact to   voice.  Moist mucous membranes.   LYMPHATICS:  No cervical or submandibular lymphadenopathy palpated.   RESPIRATORY:  Clear and equal breath sounds.  No respiratory distress,   tachypnea, or accessory muscle use.   CARDIOVASCULAR:  Heart regular, without murmurs, gallops, rubs, or thrills.   PMI not displaced.   CHEST:  Chest symmetrical without masses or tenderness. Unable to reproduce   the tenderness on palpation.   GI:  Abdomen soft, nontender, without complaint of pain to palpation.  No   hepatomegaly or splenomegaly.   MUSCULOSKELETAL:   No obvious deformities.   SKIN:  Warm and dry without rashes.   NEUROLOGICAL:  No focal deficits.   PSYCHIATRIC:  Judgment appears appropriate.  Recent and remote memory   appear to be intact.  Oriented to time, place and person.  Mood and affect   appropriate.   DISPOSITION:   The patient will be discharged home in stable condition.   Advised to follow up with her primary care physician, Dr. Starleen Arms.   The probabilistic nature of cardiac diagnostic testing was explained.  The   patient was counseled to seek further cardiac evaluation should symptoms   worsen or persist without another diagnosis being found.    Electronically Signed By:   Shanna Cisco, M.D. 02/22/2005 22:38   ____________________________   Shanna Cisco, M.D.   dh  D:  02/17/2005  T:  02/17/2005  5:31 P   [id number]   Darlen Round, PA-C

## 2005-02-17 NOTE — Procedures (Signed)
[facility]                          STRESS ECHOCARDIOGRAM REPORT   NAME:   [name], [name]                     SS#:   [id number]   DOB:     11-02-1950                                     AGE:        54   SEX:     F                                           ROOM#:     ERO EO10   MR#:    [id number]                                       DATE:   02/17/2005   REFERRING PHYSSundra Aland                                TAPE/INDEX:   322/1866   PRETEST DATA:   INDICATION:  Chest pain   MEDS TAKEN:  --   MEDS HELD:  --   TARGET HEART RATE:  168     85%:  142   RISK FACTORS:  Quit smoking in 1991   BASELINE ECG:  Normal sinus rhythm; within normal limits   EXERCISE SUPERVISED BY:  --   TEST RESULTS:             BRUCE PROTOCOL    STAGE    SPEED (MPH)   GRADE (%)    TIME (MIN:SEC)     HR       BP   Resting                                                  96     107/77      1          1.7           10            3:00         151     ---/--   Recovery                               Immediate        --     ---/--                                             2:00          98     ---/--  4:00          95     111/72                                             6:00          94     ---/--   REASON FOR STOPPING:  Achieved target heart rate              TOTAL   EXERCISE TIME:  3:00   ACHIEVED HEART RATE:  151 (90%  max HR)                       EST. METS:   --   HR RESPONSE:  Normal                                          PEAK RPP:   16,761   BP RESPONSE:  Normal                                          CHEST PAIN:   None   OBSERVED DYSRHYTHMIAS:  None   ST SEGMENT CHANGES:  J point depression with insignificant rapidly   upsloping ST segments                                 WALL MOTION ANALYSIS   LV WALL SEGMENT             PRE-EXERCISE             POST-EXERCISE   Basal Anteroseptal             Normal                 Hyperkinesis    Basal Septal                   Normal                 Hyperkinesis   Basal Inferoseptal             Normal                 Hyperkinesis   Basal Posterior                Normal                 Hyperkinesis   Basal Lateral                  Normal                 Hyperkinesis   Basal Anterior                 Normal                 Hyperkinesis   Mid Anteroseptal               Normal                 Hyperkinesis   Mid Septal  Normal                 Hyperkinesis   Mid Inferoseptal               Normal                 Hyperkinesis   Mid Posterior                  Normal                 Hyperkinesis   Mid Lateral                    Normal                 Hyperkinesis   Mid Anterior                   Normal                 Hyperkinesis   Apical Septal                  Normal                 Hyperkinesis   Apical Inferior                Normal                 Hyperkinesis   Apical Lateral                 Normal                 Hyperkinesis   Apical Anterior                Normal                 Hyperkinesis   LV  Chamber Size                                        Smaller   ECG INTERPRETATION:  Negative by ECG criteria.   ECHO INTERPRETATION:  Normal wall motion.   OVERALL IMPRESSION:  Negative for ischemia.   ECG AND ECHO INTERPRETATION BY :                                          Cyndia Diver, M.D.   ds  D: 02/17/2005  T: 02/17/2005  7:15 P    wks

## 2005-02-17 NOTE — ED Provider Notes (Signed)
[facility]                      EMERGENCY DEPARTMENT TREATMENT REPORT   NAME:  [name]            PT. LOCATION:     ERO EO10   MR #:         BILLING #: [id number]          DOS: 02/17/2005   TIME: 3:45 A   46-96-29   cc:    Kandy Garrison, M.D.   Primary Physician:   TIME SEEN:  0046   CHIEF COMPLAINT:  Chest pain.   HISTORY OF PRESENT ILLNESS:  A 54 year old female presents complaining of   chest pain that began tonight when she was trying to go to sleep.  The   patient states it was retrosternal and radiates into the precordial aspect   of her chest.  She describes it as "a nagging soreness".  She denies   shortness of breath but notes that the pain is worse when she takes a deep   breath.  She used to be a smoker.  She denies any recent travel.  She was   here earlier today for evaluation of left lower extremity swelling and had   a PVL of that extremity which returned negative for deep venous thrombosis.   She was here 1 year ago and had a stress echocardiogram negative for   ischemia.  No family history of cardiac disease.   REVIEW OF SYSTEMS:   CONSTITUTIONAL:  No fevers or chills.   EYES: No visual symptoms.   ENT: No sore throat, runny nose or other URI symptoms.   RESPIRATORY:  No cough, shortness of breath, or wheezing.   CARDIOVASCULAR:  Chest pain.   GASTROINTESTINAL:  No vomiting, diarrhea, or abdominal pain.   GENITOURINARY:  No dysuria, frequency, or urgency.   MUSCULOSKELETAL:  Left lower extremity swelling, the patient states it has   improved since she went home and elevated her legs.   INTEGUMENTARY:  No rashes.   NEUROLOGICAL:  No headaches, sensory or motor symptoms.   PAST MEDICAL HISTORY:  Negative stress test 1 year ago.   SOCIAL HISTORY:  Here with daughter.   FAMILY HISTORY:  Negative for cardiac disease.   ALLERGIES:  Penicillin, sulfa and aspirin.   MEDICATIONS:  None.   PHYSICAL EXAMINATION:    VITAL SIGNS:  Blood pressure 133/89, pulse 115, respirations 20,   temperature 98.4, O2 saturations 100%, pain 4/10.   GENERAL:  A 54 year old female presents sitting upright on the stretcher   crying.   HEAD, EARS, EYES, NOSE, THROAT:  Eyes:  Conjunctivae clear, lids normal.   Pupils equal, symmetrical, and normally reactive.   Mouth/Throat:  Surfaces   of the pharynx, palate, and tongue are pink, moist, and without lesions.   NECK:  Supple, symmetrical, trachea midline.  No cervical or submandibular   lymphadenopathy palpated.   RESPIRATORY:  Clear and equal breath sounds.  No respiratory distress,   tachypnea, or accessory muscle use.   HEART:  Regular rate and rhythm.   CHEST WALL:  Symmetrical with tenderness over the sternum.   EXTREMITIES:  Lower extremities at this time are significant only for trace   edema bilaterally.  Calves are soft, nontender, dorsalis pedis pulses   intact.   GI:  Abdomen soft, nontender, without complaint of pain to palpation.  No   hepatomegaly or splenomegaly.  SKIN:  Warm and dry without rashes.   Recent and remote memory appear to be intact.   NEUROLOGICAL:  No focal deficits.   CONTINUATION BY Salem Caster, PA-C:   INITIAL ASSESSMENT AND MANAGEMENT PLAN:   A 54 year old female presents   with nocturnal chest pain.  She is slightly  tachycardic but on review of   previous records pulse typically is between 110 and 115.   IMPRESSION/PLAN:  Patient with chest pain.  Acute ischemic coronary disease   must be considered first, and the patient protected against the   consequences of same, while other etiologies (including infectious,   metabolic, pulmonary, gastrointestinal, and musculoskeletal) are   considered.   DIAGNOSTIC STUDIES:  BMP returned within normal limits.  CBC within normal   limits, a slightly low hemoglobin and hematocrit of 9.4 and 28.9   respectively.  Cardiac enzymes within normal limits save elevated CPK of    198.  D-dimer was elevated at 1.66.  Subsequent CT scan of the chest was   read by [facility] radiology as no PE no consolidation or pleural effusions.   EKG was read by Dr. Henrene Hawking as normal sinus rhythm with a rate of 99, no   acute ischemic changes.  Chest x-ray read by Dr. Henrene Hawking as no acute   disease.   COURSE IN THE EMERGENCY DEPARTMENT: The patient received Toradol and a GI   cocktail for her discomfort.   DIAGNOSIS:  Precordial chest pain.   PLAN:  The initial Emergency Department evaluation of this patient appears   to be negative for evidence of an acute, ongoing coronary ischemia   requiring hospital admission or urgent intervention.  However, ischemic   coronary disease has not been eliminated as a consideration.  Consequently,   the patient will be assigned to the Outpatient Center for serial cardiac   enzymes and, if these are negative, resting and stress echocardiography or   other additional diagnostic testing.   Electronically Signed By:   Haze Justin, M.D. 02/25/2005 13:38   ____________________________   Haze Justin, M.D.   ndt/jh  D:  02/17/2005  T:  02/17/2005 11:40 A   [id number](pt1)   Salem Caster, PA-C

## 2005-02-17 NOTE — Procedures (Signed)
[facility]                          STRESS ECHOCARDIOGRAM REPORT   NAME:   [name], [name]                     SS#:   [id number]   DOB:     03/30/1951                                     AGE:        54   SEX:     F                                           ROOM#:     ERO EO10   MR#:    [id number]                                       DATE:   02/17/2005   REFERRING PHYS:   L. Siegel                                TAPE/INDEX:   322/1866   PRETEST DATA:   INDICATION:  Chest pain   MEDS TAKEN:  --   MEDS HELD:  --   TARGET HEART RATE:  168     85%:  142   RISK FACTORS:  Quit smoking in 1991   BASELINE ECG:  Normal sinus rhythm; within normal limits   EXERCISE SUPERVISED BY:  --   TEST RESULTS:             BRUCE PROTOCOL    STAGE    SPEED (MPH)   GRADE (%)    TIME (MIN:SEC)     HR       BP   Resting                                                  96     107/77      1          1.7           10            3:00         151     ---/--   Recovery                               Immediate        --     ---/--                                             2:00          98     ---/--                                               4:00          95     111/72                                             6:00          94     ---/--   REASON FOR STOPPING:  Achieved target heart rate              TOTAL   EXERCISE TIME:  3:00   ACHIEVED HEART RATE:  151 (90%  max HR)                       EST. METS:   --   HR RESPONSE:  Normal                                          PEAK RPP:   16,761   BP RESPONSE:  Normal                                          CHEST PAIN:   None   OBSERVED DYSRHYTHMIAS:  None   ST SEGMENT CHANGES:  J point depression with insignificant rapidly   upsloping ST segments                                 WALL MOTION ANALYSIS   LV WALL SEGMENT             PRE-EXERCISE             POST-EXERCISE   Basal Anteroseptal             Normal                 Hyperkinesis    Basal Septal                   Normal                 Hyperkinesis   Basal Inferoseptal             Normal                 Hyperkinesis   Basal Posterior                Normal                 Hyperkinesis   Basal Lateral                  Normal                 Hyperkinesis   Basal Anterior                 Normal                 Hyperkinesis   Mid Anteroseptal               Normal                 Hyperkinesis   Mid Septal                       Normal                 Hyperkinesis   Mid Inferoseptal               Normal                 Hyperkinesis   Mid Posterior                  Normal                 Hyperkinesis   Mid Lateral                    Normal                 Hyperkinesis   Mid Anterior                   Normal                 Hyperkinesis   Apical Septal                  Normal                 Hyperkinesis   Apical Inferior                Normal                 Hyperkinesis   Apical Lateral                 Normal                 Hyperkinesis   Apical Anterior                Normal                 Hyperkinesis   LV  Chamber Size                                        Smaller   ECG INTERPRETATION:  Negative by ECG criteria.   ECHO INTERPRETATION:  Normal wall motion.   OVERALL IMPRESSION:  Negative for ischemia.   ECG AND ECHO INTERPRETATION BY :                                          RAMIN ALIMARD, M.D.   ds  D: 02/17/2005  T: 02/17/2005  7:15 P    wks

## 2005-02-17 NOTE — Procedures (Signed)
[facility]                         PERIPHERAL VASCULAR LABORATORY   NAME:     [name], [name]         DATE:   02/16/2005   AGE/DOB: 54  /  08/01/1950                      ROOM #: ER   SEX:      F                                 MR #:    [id number]   CPT CODE: 93971                            SS#     [id number]   REFERRING PHYSICIAN:   MICHAEL D. OWENS, M.D.   CHIEF COMPLAINT/SYMPTOMS:  Left lower extremity swelling   EXAMINATION:  LOWER EXTREMITY VENOUS   INTERPRETATION:   Left lower extremity and right common femoral venous   doppler exam revealed patent vessels with normal spontaneity and phasicity   of signals with normal augmentation.  Deep venous valves appeared   competent.   Duplex imaging revealed no intraluminal thrombus of the left lower   extremity or right common femoral vein.   IMPRESSION:   No evidence of left lower extremity or right common femoral deep venous   thrombosis or significant venous valvular incompetence.   Electronically Signed By:   RASESH M. SHAH, M.D. 02/19/2005 13:50   ______________________________________________   RASESH M. SHAH, M.D.   lo  D: 02/17/2005  T: 02/17/2005  9:14 P  [id number]

## 2005-11-06 NOTE — ED Provider Notes (Signed)
[facility]                      EMERGENCY DEPARTMENT TREATMENT REPORT   NAME:  [name]            PT. LOCATION:     ER  QC02   MR #:         BILLING #: [id number]          DOS: 11/06/2005   TIME:   02-72-53   cc:   Primary Physician:   CHIEF COMPLAINT:  Boil to the abdomen.   HISTORY OF PRESENT ILLNESS:  This is a pleasant 55 year old female directed   to the emergency department today.  She is here complaining that she has a   boil on her abdomen.  She does have a little bit of a fever.  No chills,   nausea, vomiting, or diarrhea.   REVIEW OF SYSTEMS:   SKIN:  She has a boil to her abdomen.  The patient is complaining of   abdominal pain.   Denies complaints in any other system.   PAST MEDICAL HISTORY:  Obesity.   FAMILY HISTORY:  Negative.   SOCIAL HISTORY:  Negative.   ALLERGIES:  Aspirin, penicillin, and sulfa.   PHYSICAL EXAMINATION:   VITAL SIGNS:  Blood pressure is 98/58, pulse 112, respirations 18,   temperature 98.4, and 0-10 pain scale 10/10.   GENERAL APPEARANCE:  Patient appears well developed and well nourished.   Appearance and behavior are age and situation appropriate.   HEENT:  Head is normocephalic and atraumatic.  Eyes:  Conjunctivae clear,   lids normal.  Pupils equal, symmetrical, and normally reactive.   NECK:  Supple, nontender, symmetrical, no masses or JVD, trachea midline,   thyroid not enlarged, nodular, or tender.   RESPIRATORY:  Clear and equal breath sounds.  No respiratory distress,   tachypnea, or accessory muscle use.   CARDIOVASCULAR:  Heart has a regular rate and rhythm without any rubs,   murmurs, gallops, or thrills.   GI:  Abdomen soft, nontender, without complaint of pain to palpation.  No   hepatomegaly or splenomegaly.   SKIN:  Warm and dry without rashes.   NEUROLOGIC:  Cranial nerves, deep tendon reflexes, strength, and light   touch sensation are unremarkable.    The patient had 2% lidocaine without epinephrine.  Anesthesia was obtained.   In a sterile manner and fashion, the patient was prepped and draped.   Betadine was applied.  The patient had a 1 cm incision to her abdomen.   Large amounts of purulent material were removed.  It was packed with   iodoform gauze.  Cultures were sent upstairs to the floor.   FINAL DIAGNOSIS:  Acute abdominal wall abscess.   Come back in 2 days for packing removal.  She was placed on clindamycin for   10 days.  Vicodin also was given for the pain.   Electronically Signed By:   Stormy Card, M.D. 11/13/2005 21:22   ____________________________   Stormy Card, M.D.   My signature above authenticates this document and my orders, the final   diagnosis(es), discharge prescription(s) and instructions in the [facility] record.   st  D:  11/07/2005  T:  11/08/2005  7:36 A   [id number]   Hilaria Ota, PA-C

## 2007-01-16 NOTE — ED Provider Notes (Signed)
[facility]                      EMERGENCY DEPARTMENT TREATMENT REPORT   NAME:  [name], [name]  PT. LOCATION:    ER  ER32       DOB:  [birth year]                                                                     AGE:  56   MR #:       BILLING #:           DOS: 01/16/2007  TIME:          SEX:  F   [id number]    [id number]   cc:   Primary Physician:   My evaluation time on 01/17/07 is 12:40 a.m.   CHIEF COMPLAINT:  Headache and back pain.   HISTORY OF PRESENT ILLNESS:  This is a 56 year old female who comes in with   2 separate complaints.  First she says for a week she has had an on and off   headache on the left side of her head.  It comes and goes.  It is gradual   in onset and is not the worst headache she has ever had.  It feels similar   to headaches she has had in the past.  She feels it is allergic because   when she takes Zyrtec and Tylenol it improves and it started while she was   outdoors around smells that tend to bring on headaches for her.  She has   not had any vomiting or vision change.  No eye pain.  She denies any chest   pain or trouble breathing.  Additionally relates that she has had pain in   her right low back for the last 2 days.  It hurts to move.  She has had no   trauma.  She denies any urinary symptoms or complaints.  She denies any   abdominal pain or vomiting.   REVIEW OF SYSTEMS:   CONSTITUTIONAL: No fevers or chills.   EYES: No visual symptoms.   ENT: No sore throat, runny nose, or other URI symptoms.   RESPIRATORY: No cough, shortness of breath, or wheezing.   CARDIOVASCULAR: No chest pain, chest pressure, or palpitations.   GASTROINTESTINAL: No vomiting, diarrhea, or abdominal pain.   MUSCULOSKELETAL: See HPI.   INTEGUMENTARY: No rashes.   NEURO: See HPI.   PAST MEDICAL HISTORY:  None.   SOCIAL HISTORY:  Is here alone.   MEDICATIONS:  None.   ALLERGIES:  Aspirin, penicillin, and sulfa.   PHYSICAL EXAM:    GENERAL APPEARANCE:  Patient appears well developed and well nourished.   Appearance and behavior are age and situation appropriate.   VITAL SIGNS:  Blood pressure is 117/90, pulse 108, respirations 18,   temperature 98.8, 98% on room air, and 3/10 pain.   EYES:  Pupils equal, symmetrical, and normal reactive.  No injection.   Ears/Nose:  Hearing is grossly intact to voice.  Internal and external   examinations of the ears and nose are unremarkable.   Mouth/Throat:  Surfaces of the pharynx, palate, and tongue are pink, moist,   and without lesions.  NECK:  Supple, nontender, symmetrical, no masses or JVD, trachea midline.   Thyroid not enlarged, nodular, or tender.  No cervical or submandibular   lymphadenopathy palpated.   RESPIRATORY:  Clear and equal breath sounds.  No respiratory distress,   tachypnea, or accessory muscle use.   CARDIOVASCULAR:  Heart regular, without murmurs, gallops, rubs, or thrills.   PMI not displaced.   GI:  Abdomen soft, nontender, without complaint of pain to palpation.  No   hepatomegaly or splenomegaly.   BACK:  Nontender midline cervical, thoracic, and lumbar spine.  No bony   step-off or area of ecchymosis or swelling.  No CVA tenderness.  Nontender   in the muscles of the lumbar area.  Hips and pelvis are stable.   INTEGUMENTARY:  No rash.   NEURO:  The patient is awake, alert, and oriented.  She has no deficits   focally.  She walks with a normal gait, her extraocular movements are   intact, and her cranial nerves are intact.   INITIAL ASSESSMENT:  A patient here with 2 complaints, one being headache.   At this time Dr. Claudette Laws has been in the room to see the patient and feels   that we should obtain a head CT.  Additionally with low back discomfort   that seems musculoskeletal.  We will check a urine dip.   DIAGNOSTIC STUDIES:  Urine dip is negative.  Blood sugar is 103.   CT of head was interrupted by [facility] with no acute   abnormality.    CLINICAL IMPRESSION AND DIAGNOSIS:   1. Headache.   2. Back pain, likely muscular.   DISPOSITION:  Home.  Advised she should follow up with Dr. Dawna Part if not   improving.  She was given a Vicodin here and given a prescription for #12   Vicodin for home.  She should certainly follow up here if she develops   dizziness, new or worsening symptoms or concerns, vomiting, or fevers.  The   patient is discharged with verbal and written instructions and a referral   for ongoing care.  The patient is aware that they may return at any time   for new or worsening symptoms.   Electronically Signed By:   Thornton Dales, M.D. 01/19/2007   11:31   ____________________________   Thornton Dales, M.D.   My signature above authenticates this document and my orders, the final   diagnosis(es), discharge prescription(s) and instructions in the [facility] record.   ST  D:  01/16/2007  T:  01/18/2007  3:57 P   [id number]

## 2007-03-12 NOTE — ED Provider Notes (Signed)
[facility]                      EMERGENCY DEPARTMENT TREATMENT REPORT   NAME:  [name],       PT. LOCATION:      ERO EO08          DOB:   [birth year]                                                                 AGE:   MR #:      BILLING #:           DOA:  03/12/2007   DOD:              SEX:  F   49-71-31   [id number]   cc:   CHIEF COMPLAINT:  Right lower quadrant abdominal pain for 3 days.   HPI:  This is a pleasant 56 year old female coming in complaining of   right lower quadrant abdominal pain on and off for the past 3 days and   abdominal bloating, gradually increasing in intensity.  The patient denies   any vaginal discharge.  Denies any nausea, vomiting, or any fever.  She   does state that her last menses was back in May of 2008, and believes she   has started menopause.  The patient is coming in for further evaluation and   treatment today.   REVIEW OF SYSTEMS:   CONSTITUTIONAL: No fever, chills, or weight loss.   EYES: No visual symptoms.   RESPIRATORY: No cough, shortness of breath, or wheezing.   CARDIOVASCULAR: No chest pain, chest pressure, or palpitations.   GASTROINTESTINAL:  Abdominal swelling and discomfort.   GENITOURINARY: No dysuria, frequency, or urgency.   MUSCULOSKELETAL: No joint pain or swelling.   NEUROLOGICAL: No headaches, sensory or motor symptoms.   PAST MEDICAL HISTORY:  Noncontributory.   ALLERGIES:  Allergic to aspirin, penicillin, and sulfa drugs.   SOCIAL HISTORY:  Denies alcohol or tobacco abuse.   PHYSICAL EXAMINATION:   VITAL SIGNS:  BP 127/82, pulse 110, respiratory rate 18, temperature 99.6,   pain 4/10.   GENERAL APPEARANCE:  Patient appears well developed and well nourished.   Appearance and behavior are age and situation appropriate.   Eyes:  Conjunctivae clear, lids normal.  Pupils equal, symmetrical, and   normally reactive.   RESPIRATORY:  Clear and equal breath sounds.  No respiratory distress,    tachypnea, or accessory muscle use.   CARDIOVASCULAR:  Heart regular, without murmurs, gallops, rubs, or thrills.   PMI not displaced.   CHEST:  Chest symmetrical without masses or tenderness.   GI:  Abdomen soft, nontender, without complaint of pain to palpation.  No   hepatomegaly or splenomegaly.   PELVIC:  External genitalia are normal.  There is no blood or discharge in   the vagina.  Uterus is normal size, and adnexa are nontender and without   palpable masses.   CONTINUATION BY DR. Noland Fordyce:   DIAGNOSTIC TESTING: CT scan:  Impression normal appendix.  Laboratory:   Sodium 137, potassium 4.0, chloride 104, CO2 28, glucose 101, BUN 7,   creatinine 1.0, SGOT 52, SGPT 51, alkaline phosphatase 191, total bilirubin   0.2, calcium 9.6, total  protein 8.4, albumin 3.4, lipase 51.  CBC:  White   blood count 7.6, hemoglobin 9.8, hematocrit 30.6, platelets 380.   FINAL DIAGNOSIS:  Abdominal pain.   DISPOSITION: Follow up with Dr. Kermit Balo. [place].  Condition stable.   Electronically Signed By:   Faythe Ghee, M.D. 04/01/2007 15:06   ____________________________   Faythe Ghee, M.D.   My signature above authenticates this document and my orders, the final   diagnosis(es), discharge prescription(s) and instructions in the [facility] record.   ST  D:  03/12/2007  T:  03/13/2007  1:34 A   [id number]   TIMOTHY REISS, PA

## 2015-12-25 ENCOUNTER — Inpatient Hospital Stay
Admit: 2015-12-25 | Discharge: 2015-12-25 | Disposition: A | Discharge status: Home / Self Care | Payer: Self-pay | Attending: Emergency Medicine

## 2015-12-25 DIAGNOSIS — R42 Dizziness and giddiness: Secondary | ICD-10-CM

## 2015-12-25 LAB — METABOLIC PANEL, BASIC
BUN: 10 mg/dl (ref 7–25)
CO2: 28 mEq/L (ref 21–32)
Calcium: 9.3 mg/dl (ref 8.5–10.1)
Chloride: 109 mEq/L — ABNORMAL HIGH (ref 98–107)
Creatinine: 0.8 mg/dl (ref 0.6–1.3)
GFR est AA: >60
GFR est non-AA: >60
Glucose: 113 mg/dl — ABNORMAL HIGH (ref 74–106)
Potassium: 3.9 mEq/L (ref 3.5–5.1)
Sodium: 140 mEq/L (ref 136–145)

## 2015-12-25 LAB — CBC WITH AUTOMATED DIFF
BASOPHILS: 1 % (ref 0–3)
EOSINOPHILS: 3.9 % (ref 0–5)
HCT: 38.5 % (ref 37.0–50.0)
HGB: 12.6 gm/dl — ABNORMAL LOW (ref 13.0–17.2)
IMMATURE GRANULOCYTES: 0.4 % (ref 0.0–3.0)
LYMPHOCYTES: 44.8 % (ref 28–48)
MCH: 28.5 pg (ref 25.4–34.6)
MCHC: 32.7 gm/dl (ref 30.0–36.0)
MCV: 87.1 fL (ref 80.0–98.0)
MONOCYTES: 8.2 % (ref 1–13)
MPV: 10.8 fL — ABNORMAL HIGH (ref 6.0–10.0)
NEUTROPHILS: 41.7 % (ref 34–64)
NRBC: 0 (ref 0–0)
PLATELET: 277 10*3/uL (ref 140–450)
RBC: 4.42 M/uL (ref 3.60–5.20)
RDW-SD: 48.6 — ABNORMAL HIGH (ref 36.4–46.3)
WBC: 5.1 10*3/uL (ref 4.0–11.0)

## 2015-12-25 LAB — POC URINE MACROSCOPIC
Bilirubin: NEGATIVE
Glucose: NEGATIVE mg/dl
Ketone: NEGATIVE mg/dl
Leukocyte Esterase: NEGATIVE
Nitrites: NEGATIVE
Protein: NEGATIVE mg/dl
Specific gravity: 1.01 (ref 1.005–1.030)
Urobilinogen: 0.2 EU/dl (ref 0.0–1.0)
pH (UA): 7 (ref 5–9)

## 2015-12-25 LAB — EKG, 12 LEAD, INITIAL
Atrial Rate: 95 {beats}/min
Calculated P Axis: 39 degrees
Calculated R Axis: -14 degrees
Calculated T Axis: 21 degrees
Diagnosis: NORMAL
P-R Interval: 162 ms
Q-T Interval: 374 ms
QRS Duration: 72 ms
QTC Calculation (Bezet): 469 ms
Ventricular Rate: 95 {beats}/min

## 2015-12-25 MED ORDER — SODIUM CHLORIDE 0.9 % IJ SYRG
Freq: Once | INTRAMUSCULAR | Status: DC
Start: 2015-12-25 — End: 2015-12-25

## 2015-12-25 MED ORDER — SODIUM CHLORIDE 0.9% BOLUS IV
0.9 % | Freq: Once | INTRAVENOUS | Status: DC
Start: 2015-12-25 — End: 2015-12-25

## 2015-12-25 NOTE — ED Provider Notes (Signed)
[facility]  Emergency Department Treatment Report    Patient: [name] Age: 65 y.o. Sex: female    Date of Birth: 1951/01/11 Admit Date: 12/25/2015 PCP: Christa See, MD   MRN: [id number]  CSN: [id number]     Room: ER07/ER07 Time Dictated: 4:23 PM      Chief Complaint   Chief Complaint   Patient presents with   ??? Dizziness   ??? Dehydration       History of Present Illness   65 y.o. female presents the ER today stating that she feels dizzy and dehydrated. She reports that she's felt like this in the past she's had low potassium. She states that on Sunday she was outside in the heat and did a lot of yard work, she believes this is what got her dehydrated. Patient reports dizziness with movement she denies any headaches, chest pain, or abdominal pain.  Patient states that she's been under a great deal of stress and feels that stress may be contributing to some of her symptoms. Patient states that a lot of her stresses revolving around her family she currently lives with. She denies any thoughts of hurting herself or depression.    Review of Systems   Constitutional: No fever, chills, or weight loss  Eyes: No visual symptoms.  ENT: No sore throat, runny nose or ear pain.  Respiratory: No cough, dyspnea or wheezing.  Cardiovascular: No chest pain, pressure, palpitations, tightness or heaviness.  Gastrointestinal: No vomiting, diarrhea or abdominal pain.  Genitourinary: No dysuria, frequency, or urgency.  Musculoskeletal: No joint pain or swelling.  Integumentary: No rashes.  Neurological: No headaches, sensory or motor symptoms.  Denies complaints in all other systems.    Past Medical/Surgical History   No past medical history on file.  No past surgical history on file.    Social History     Social History     Social History   ??? Marital status: SINGLE     Spouse name: N/A   ??? Number of children: N/A   ??? Years of education: N/A     Social History Main Topics    ??? Smoking status: Not on file   ??? Smokeless tobacco: Never Used   ??? Alcohol use No   ??? Drug use: No   ??? Sexual activity: Not on file     Other Topics Concern   ??? Not on file     Social History Narrative   ??? No narrative on file       Family History   No family history on file.    Home Medications     None       Allergies     Allergies   Allergen Reactions   ??? Pcn [Penicillins] Hives       Physical Exam   ED Triage Vitals   Enc Vitals Group      BP 12/25/15 1221 156/97      Pulse (Heart Rate) 12/25/15 1221 103      Resp Rate 12/25/15 1221 18      Temp 12/25/15 1221 98.3 ??F (36.8 ??C)      Temp src --       O2 Sat (%) 12/25/15 1221 100 %      Weight 12/25/15 1419 180 lb      Height 12/25/15 1419 5\' 7"       Head Cir --       Peak Flow --  Pain Score --       Pain Loc --       Pain Edu? --       Excl. in [place]? --      Constitutional: Patient appears well developed and well nourished. Patient appears very anxious.  HEENT: Conjunctiva clear.  PERRLA. Mucous membranes moist, non-erythematous.   Neck: supple, non tender, symmetrical, no masses or JVD.   Respiratory: lungs clear to auscultation, nonlabored respirations. No tachypnea or accessory muscle use.  Cardiovascular: heart regular rate and rhythm without murmur rubs or gallops.   Calves soft and non-tender. Distal pulses 2+ and equal bilaterally.  No peripheral edema or significant variscosities.    Gastrointestinal:  Abdomen soft, nontender without complaint of pain to palpation  Musculoskeletal: Nail beds pink with prompt capillary refill  Integumentary: warm and dry without rashes or lesions  Neurologic: alert and oriented, Sensation intact, motor strength equal and symmetric.  No facial asymmetry or dysarthria.      Impression and Management Plan   Patient's being evaluated today for her reported dizziness along with feeling dehydrated. I will obtain basic laboratory studies and a screening EKG. Patient will be hydrated with liter of IV fluids and reevaluated.   Diagnostic Studies   Lab:   Labs Reviewed   CBC WITH AUTOMATED DIFF - Abnormal; Notable for the following:        Result Value    HGB 12.6 (*)     MPV 10.8 (*)     RDW-SD 48.6 (*)     All other components within normal limits   METABOLIC PANEL, BASIC - Abnormal; Notable for the following:     Chloride 109 (*)     Glucose 113 (*)     All other components within normal limits   POC URINE MACROSCOPIC - Abnormal; Notable for the following:     Blood Trace-lysed (*)     All other components within normal limits   POC URINE DIPSTICK   POC URINE DIPSTICK       Imaging:    No results found.    EKG: Dr. Rigoberto Noel did not see any acute S-T segment or T-wave abnormalities that are consistent with acute ischemia or infarction.    ED Course/Medical Decision Making   The patient was in the ER she was given a liter of IV fluids. She  reevaluated and told me she was feeling better. The orthostatic vital signs were completed and patient's heart rate remained normal. She did have an increase in her blood pressure. She was very concerned with this blood pressure reading. I reviewed her blood pressures while she was in the ER and they were within normal limits. Patient was reassured that I was not concerned with a single reading of elevated blood pressure. Patient's daughter was at the bedside and we discussed patient's symptoms along with her increased stress. Patient reports that she's been very stressed out and she came to the ER to make sure that there was nothing wrong with her physically. He was discussed to call her family doctor tomorrow and make an appointment for further discussion on how to handle her stress. Patient agreeable with plan and can be discharged home at this time. Patient's daughter agrees to return to the ER if she develops any worsening symptoms.    Final Diagnosis       ICD-10-CM ICD-9-CM   1. Dizzy R42 780.4   2. Fatigue, unspecified type R53.83 780.79       Disposition  Home    Discussed patient, findings, and treatments with Dr. Rigoberto Noel prior to discharge and they are in agreement with treatment and discharge plan.    Shirley Friar Shantia Sanford, NP  December 25, 2015    My signature above authenticates this document and my orders, the final ??  diagnosis (es), discharge prescription (s), and instructions in the Epic ??  record.  If you have any questions please contact [phone].  ??  Nursing notes have been reviewed by the physician/ advanced practice ??  Clinician.    Dragon medical dictation software was used for portions of this report.  Unintended transcription errors may occur.

## 2015-12-25 NOTE — ED Triage Notes (Signed)
Brought in by ems for complains of dizziness, and possible dehydration since she worked on her yard last Sunday, BP is 140/95.  No hx, does not take medications on a regular basis

## 2016-04-08 ENCOUNTER — Encounter

## 2016-04-22 ENCOUNTER — Ambulatory Visit: Attending: Gynecologic Oncology | Primary: Family Medicine

## 2016-04-22 NOTE — Telephone Encounter (Signed)
Unable to reach patient regarding no show for appointment on 04/22/16, no answer on number listed.  Dr. Beacher May office notified.

## 2016-04-22 NOTE — Progress Notes (Signed)
error 

## 2016-05-01 ENCOUNTER — Encounter

## 2016-05-06 ENCOUNTER — Inpatient Hospital Stay: Admit: 2016-05-06 | Payer: MEDICAID | Attending: Obstetrics & Gynecology | Primary: Family Medicine

## 2016-05-06 ENCOUNTER — Encounter

## 2016-05-06 DIAGNOSIS — N6320 Unspecified lump in the left breast, unspecified quadrant: Secondary | ICD-10-CM

## 2016-05-09 ENCOUNTER — Ambulatory Visit
Admit: 2016-05-09 | Discharge: 2016-05-09 | Payer: PRIVATE HEALTH INSURANCE | Attending: Gynecologic Oncology | Primary: Family Medicine

## 2016-05-09 DIAGNOSIS — C541 Malignant neoplasm of endometrium: Secondary | ICD-10-CM

## 2016-05-09 NOTE — Progress Notes (Signed)
[facility]  [address], [address]  [place], [place] [id number]  [address], [address]  [place], [place] [id number]  [phone] FAX [phone]  Azell Derhristopher Noemy Hallmon DO      Patient ID:  Name:  April HammersLorraine Patterson [name]  MRN:  [id number]  DOB:  1950/12/16/65 y.o.  Date:  05/09/2016      HISTORY OF PRESENT ILLNESS:  April HammersLorraine Patterson [name] is a 65 y.o.  African American postmenopausal female referred by Dr. Joselyn Arrowjo-Carron for FIGO Grade 2 endometrioid adenocarcinoma, diagnosed on EMB done 110/11/17 for PMB and thickened endometrium.  Pt currently denies any bleeding. Has occasional cramping. Denies N/V.     Pathology:  Endometrioid adenocarcinoma, FIGO II    Imaging  04/04/16 TVUS scanned in media  Uterus: 9.4 x 5.7 x 4.4 cm       ROS:   As above      There is no problem list on file for this patient.        Past Medical History:   Diagnosis Date   ??? Borderline diabetes mellitus      Past Surgical History:   Procedure Laterality Date   ??? HX OTHER SURGICAL Right     knee   ??? HX OTHER SURGICAL Right     foot    ??? HX TONSILLECTOMY         OB History     Gravida Para Term Preterm AB Living    9 8 8  1 8     SAB TAB Ectopic Molar Multiple Live Births         8                    Social History   Substance Use Topics   ??? Smoking status: Not on file   ??? Smokeless tobacco: Never Used   ??? Alcohol use No     Family History   Problem Relation Age of Onset   ??? Breast Cancer Mother    ??? Diabetes Mother    ??? Heart Disease Mother    ??? Diabetes Other      family hx of   ??? Heart Disease Maternal Grandmother        No current outpatient prescriptions on file.     No current facility-administered medications for this visit.      Allergies   Allergen Reactions   ??? Aspirin Unknown (comments)   ??? Pcn [Penicillins] Hives   ??? Sulfa (Sulfonamide Antibiotics) Unknown (comments)          OBJECTIVE:    Physical Exam  Visit Vitals    ??? BP 135/88 (BP 1 Location: Left arm, BP Patient Position: Sitting)   ??? Pulse (!) 110   ??? Temp 97 ??F (36.1 ??C) (Oral)   ??? Ht 5\' 2"  (1.575 m)   ??? Wt 107.8 kg (237 lb 9.6 oz)   ??? SpO2 98%   ??? BMI 43.46 kg/m2       GENERAL APP: in no apparent distress and well developed and well nourished   MUSCULOSKEL: no joint tenderness, deformity or swelling   INTEGUMENT:  warm and dry, no rashes or lesions   ABDOMEN .soft, NT, ND, No masses appreciated   EXTREMITIES: extremities normal, atraumatic, no cyanosis or edema   PELVIC: External genitalia: normal general appearance  Vaginal: cystocele present,  Cervix: normal appearance  Adnexa: normal bimanual exam  Uterus: slightly enlarged   RECTAL: deferred  NODAL SURVEY: Cervical, supraclavicular, axillary and inguinal nodes normal.   NEURO: Grossly normal         IMPRESSION/PLAN:  1. 1. Grade 2 Endometrial Cancer   -reviewed her pathology and discussed likelihood that it is confined to uterus and favorable prognosis   -discussed recommendation for hysterectomy/bso with intraoperative pathology to help decide about lymph node dissection   -discussed robotic approach   -briefly discussed possible although unlikely need for adjuvant treatment   -Risks, benefits and alternatives of surgery discussed in detail   -plan for surgery at [facility] on 12/7   -all of patients questions and concerns addressed      The total time spent was 60 minutes regarding this patients diagnosis of endometrial cancer and >50% of this time was spent counseling and coordinating care    [facility]  05/09/2016 12:35 PM

## 2016-05-09 NOTE — Progress Notes (Signed)
[name], a 65 y.o. female,  is here for   Chief Complaint   Patient presents with   ??? Referral Follow Up     Dr. Carmelina Paddock; endometrial cancer       Visit Vitals   ??? BP 135/88 (BP 1 Location: Left arm, BP Patient Position: Sitting)   ??? Pulse (!) 110   ??? Temp 97 ??F (36.1 ??C) (Oral)   ??? Ht 5\' 2"  (1.575 m)   ??? Wt 107.8 kg (237 lb 9.6 oz)   ??? SpO2 98%   ??? BMI 43.46 kg/m2       Patient reports having "a lot of bleeding" which prompted her to go see her OBGYN. She received a PAP and an Endometrial Biopsy. Her OB then referred her here. Patient is no longer experiencing any vaginal bleeding. Patient is also experiencing pelvic pain that she states isn't as bad as when she was bleeding. She's been taking Tylenol to help alleviate the pain, however it flares up occasionally and the pt is requesting something stronger to help. No changes in bladder or bowel habits.    1. Have you been to the ER, urgent care clinic since your last visit?  Hospitalized since your last visit?No    2. Have you seen or consulted any other health care providers outside of the [facility] since your last visit?  Include any pap smears or colon screening. new patient

## 2016-05-29 ENCOUNTER — Encounter

## 2016-05-29 ENCOUNTER — Ambulatory Visit: Admit: 2016-05-29 | Discharge: 2016-05-29 | Primary: Family Medicine

## 2016-05-29 ENCOUNTER — Encounter: Attending: Women's Health | Primary: Family Medicine

## 2016-05-29 DIAGNOSIS — Z01818 Encounter for other preprocedural examination: Secondary | ICD-10-CM

## 2016-05-29 NOTE — Progress Notes (Signed)
Reviewed consent form for [facility] and information packet for a robotic assitested total laparoscopic hysterectomy, BSO, possible staging  scheduled on 06/05/2016 at 0830. Patient was given information packet that included pre-op and post-op instructions as well as the scheduled date, start time, arrival time, and length of the surgery and signed the consent form. Patient verbalized understanding and had no further questions. Patient  encouraged to call if they have questions later.

## 2016-06-04 NOTE — Telephone Encounter (Signed)
Patient called the office because she has a worsening cough and is concerned about having surgery on 06/04/16.  Message was forwarded to Dr. Verner Mould who is in surgery today.

## 2016-06-18 NOTE — Telephone Encounter (Signed)
Tried to reach patient to reschedule surgery but call could not be completed.  Will try again.

## 2016-07-03 ENCOUNTER — Encounter: Attending: Women's Health | Primary: Family Medicine

## 2016-07-31 NOTE — Telephone Encounter (Signed)
Tried reaching patient again to reschedule surgery from December but number was not correct.  Will mail a letter.

## 2016-10-07 ENCOUNTER — Encounter: Attending: Gynecologic Oncology | Primary: Family Medicine

## 2016-10-10 ENCOUNTER — Ambulatory Visit
Admit: 2016-10-10 | Discharge: 2016-10-10 | Payer: PRIVATE HEALTH INSURANCE | Attending: Gynecologic Oncology | Primary: Family Medicine

## 2016-10-10 DIAGNOSIS — C541 Malignant neoplasm of endometrium: Secondary | ICD-10-CM

## 2016-10-10 NOTE — Progress Notes (Signed)
[facility]  [address] [place], [place]  [place], [place]  [place], [place]  [phone] FAX [phone]  Moss Mc DO      Patient ID:  Name:  [name]  MRN:  [id number]  DOB:  03-Jul-1950/66 y.o.  Date:  05/09/2016      HISTORY OF PRESENT ILLNESS:  [name] is a 66 y.o.  African American postmenopausal female referred by Dr. Sherene Sires for FIGO Grade 2 endometrioid adenocarcinoma, diagnosed on EMB done 110/11/17 for PMB and thickened endometrium. Was lost to f/u bc she had no insurance. Has insurance now and would like to proceed with surgery  Has occasional cramping. Denies N/V. **    Pathology:  Endometrioid adenocarcinoma, FIGO II    Imaging  04/04/16 TVUS scanned in media  Uterus: 9.4 x 5.7 x 4.4 cm       ROS:   As above      Patient Active Problem List   Diagnosis Code   ??? Obesity, morbid ([place]) E66.01         Past Medical History:   Diagnosis Date   ??? Borderline diabetes mellitus      Past Surgical History:   Procedure Laterality Date   ??? HX OTHER SURGICAL Right     knee   ??? HX OTHER SURGICAL Right     foot    ??? HX TONSILLECTOMY         OB History     Gravida Para Term Preterm AB Living    9 8 8  1 8     SAB TAB Ectopic Molar Multiple Live Births         8                    Social History   Substance Use Topics   ??? Smoking status: Not on file   ??? Smokeless tobacco: Never Used   ??? Alcohol use No     Family History   Problem Relation Age of Onset   ??? Breast Cancer Mother    ??? Diabetes Mother    ??? Heart Disease Mother    ??? Diabetes Other      family hx of   ??? Heart Disease Maternal Grandmother        No current outpatient prescriptions on file.     No current facility-administered medications for this visit.      Allergies   Allergen Reactions   ??? Aspirin Unknown (comments)   ??? Pcn [Penicillins] Hives   ??? Sulfa (Sulfonamide Antibiotics) Unknown (comments)          OBJECTIVE:     Physical Exam  Visit Vitals   ??? BP (!) 127/94 (BP 1 Location: Right arm, BP Patient Position: Sitting)   ??? Pulse (!) 105   ??? Temp 97.6 ??F (36.4 ??C) (Oral)   ??? Ht 5\' 2"  (1.575 m)   ??? Wt 110.5 kg (243 lb 9.6 oz)   ??? SpO2 100%   ??? BMI 44.56 kg/m2       GENERAL APP: in no apparent distress and well developed and well nourished   MUSCULOSKEL: no joint tenderness, deformity or swelling   INTEGUMENT:  warm and dry, no rashes or lesions   ABDOMEN .soft, NT, ND, No masses appreciated   EXTREMITIES: extremities normal, atraumatic, no cyanosis or edema   PELVIC: defered   RECTAL: deferred  NODAL SURVEY: Cervical, supraclavicular, axillary and inguinal nodes normal.   NEURO: Grossly normal         IMPRESSION/PLAN:  1. 1. Grade 2 Endometrial Cancer   -previously reviewed her pathology and discussed likelihood that it is confined to uterus and favorable prognosis   -discussed recommendation for hysterectomy/bso with intraoperative pathology to help decide about lymph node dissection   -discussed robotic approach   -briefly discussed possible although unlikely need for adjuvant treatment   -Risks, benefits and alternatives of surgery discussed in detail   -plan for surgery at [facility] on 4/30   -all of patients questions and concerns addressed      The total time spent was  40 minutes regarding this patients diagnosis of endometrial cancer and >50% of this time was spent counseling and coordinating care    [facility]  05/09/2016 12:35 PM

## 2016-10-10 NOTE — H&P (View-Only) (Signed)
[facility]  [address], [place], [place]  [place], [place]  [place], [place]  [phone] FAX [phone]  Moss Mc DO      Patient ID:  Name:  [name]  MRN:  [id number]  DOB:  January 23, 1951/65 y.o.  Date:  05/09/2016      HISTORY OF PRESENT ILLNESS:  [name] is a 66 y.o.  African American postmenopausal female referred by Dr. Sherene Sires for FIGO Grade 2 endometrioid adenocarcinoma, diagnosed on EMB done 110/11/17 for PMB and thickened endometrium. Was lost to f/u bc she had no insurance. Has insurance now and would like to proceed with surgery  Has occasional cramping. Denies N/V. **    Pathology:  Endometrioid adenocarcinoma, FIGO II    Imaging  04/04/16 TVUS scanned in media  Uterus: 9.4 x 5.7 x 4.4 cm       ROS:   As above      Patient Active Problem List   Diagnosis Code   ??? Obesity, morbid ([place]) E66.01         Past Medical History:   Diagnosis Date   ??? Borderline diabetes mellitus      Past Surgical History:   Procedure Laterality Date   ??? HX OTHER SURGICAL Right     knee   ??? HX OTHER SURGICAL Right     foot    ??? HX TONSILLECTOMY         OB History     Gravida Para Term Preterm AB Living    9 8 8  1 8     SAB TAB Ectopic Molar Multiple Live Births         8                    Social History   Substance Use Topics   ??? Smoking status: Not on file   ??? Smokeless tobacco: Never Used   ??? Alcohol use No     Family History   Problem Relation Age of Onset   ??? Breast Cancer Mother    ??? Diabetes Mother    ??? Heart Disease Mother    ??? Diabetes Other      family hx of   ??? Heart Disease Maternal Grandmother        No current outpatient prescriptions on file.     No current facility-administered medications for this visit.      Allergies   Allergen Reactions   ??? Aspirin Unknown (comments)   ??? Pcn [Penicillins] Hives   ??? Sulfa (Sulfonamide Antibiotics) Unknown (comments)          OBJECTIVE:     Physical Exam  Visit Vitals   ??? BP (!) 127/94 (BP 1 Location: Right arm, BP Patient Position: Sitting)   ??? Pulse (!) 105   ??? Temp 97.6 ??F (36.4 ??C) (Oral)   ??? Ht 5\' 2"  (1.575 m)   ??? Wt 110.5 kg (243 lb 9.6 oz)   ??? SpO2 100%   ??? BMI 44.56 kg/m2       GENERAL APP: in no apparent distress and well developed and well nourished   MUSCULOSKEL: no joint tenderness, deformity or swelling   INTEGUMENT:  warm and dry, no rashes or lesions   ABDOMEN .soft, NT, ND, No masses appreciated   EXTREMITIES: extremities normal, atraumatic, no cyanosis or edema   PELVIC: defered   RECTAL: deferred  NODAL SURVEY: Cervical, supraclavicular, axillary and inguinal nodes normal.   NEURO: Grossly normal         IMPRESSION/PLAN:  1. 1. Grade 2 Endometrial Cancer   -previously reviewed her pathology and discussed likelihood that it is confined to uterus and favorable prognosis   -discussed recommendation for hysterectomy/bso with intraoperative pathology to help decide about lymph node dissection   -discussed robotic approach   -briefly discussed possible although unlikely need for adjuvant treatment   -Risks, benefits and alternatives of surgery discussed in detail   -plan for surgery at [facility] on 4/30   -all of patients questions and concerns addressed      The total time spent was  40 minutes regarding this patients diagnosis of endometrial cancer and >50% of this time was spent counseling and coordinating care    [facility]  05/09/2016 12:35 PM

## 2016-10-20 ENCOUNTER — Encounter: Primary: Family Medicine

## 2016-10-20 NOTE — Telephone Encounter (Signed)
Called patient in regards to her surgery counseling scheduled for today at 1300. Patient stated that she had accidentally wrote down her appointment for tomorrow 10/21/2016 @ 1300 instead of today. Touched base with NP Manzlak who agreed that the patient can come tomorrow at 1300. Appointment changed. Patient was satisfied with this and had no further questions or concerns, encouraged to call back if she develops any.

## 2016-10-21 ENCOUNTER — Encounter

## 2016-10-21 ENCOUNTER — Inpatient Hospital Stay: Admit: 2016-10-21 | Payer: MEDICAID | Primary: Family Medicine

## 2016-10-21 ENCOUNTER — Ambulatory Visit: Admit: 2016-10-21 | Discharge: 2016-10-21 | Payer: PRIVATE HEALTH INSURANCE | Primary: Family Medicine

## 2016-10-21 ENCOUNTER — Encounter: Primary: Family Medicine

## 2016-10-21 DIAGNOSIS — Z7189 Other specified counseling: Secondary | ICD-10-CM

## 2016-10-21 DIAGNOSIS — Z01818 Encounter for other preprocedural examination: Secondary | ICD-10-CM

## 2016-10-21 LAB — METABOLIC PANEL, COMPREHENSIVE
A-G Ratio: 0.9 (ref 0.8–1.7)
ALT (SGPT): 30 U/L (ref 13–56)
AST (SGOT): 27 U/L (ref 15–37)
Albumin: 3.6 g/dL (ref 3.4–5.0)
Alk. phosphatase: 123 U/L — ABNORMAL HIGH (ref 45–117)
Anion gap: 7 mmol/L (ref 3.0–18)
BUN/Creatinine ratio: 9 — ABNORMAL LOW (ref 12–20)
BUN: 8 MG/DL (ref 7.0–18)
Bilirubin, total: 0.3 MG/DL (ref 0.2–1.0)
CO2: 30 mmol/L (ref 21–32)
Calcium: 9.4 MG/DL (ref 8.5–10.1)
Chloride: 106 mmol/L (ref 100–108)
Creatinine: 0.85 MG/DL (ref 0.6–1.3)
GFR est AA: >60 mL/min/{1.73_m2} (ref >60)
GFR est non-AA: >60 mL/min/{1.73_m2} (ref >60)
Globulin: 4 g/dL (ref 2.0–4.0)
Glucose: 93 mg/dL (ref 74–99)
Potassium: 4.3 mmol/L (ref 3.5–5.5)
Protein, total: 7.6 g/dL (ref 6.4–8.2)
Sodium: 143 mmol/L (ref 136–145)

## 2016-10-21 LAB — CBC WITH AUTOMATED DIFF
ABS. BASOPHILS: 0 10*3/uL (ref 0.0–0.06)
ABS. EOSINOPHILS: 0.3 10*3/uL (ref 0.0–0.4)
ABS. LYMPHOCYTES: 2.3 10*3/uL (ref 0.9–3.6)
ABS. MONOCYTES: 0.4 10*3/uL (ref 0.05–1.2)
ABS. NEUTROPHILS: 2.9 10*3/uL (ref 1.8–8.0)
BASOPHILS: 1 % (ref 0–2)
EOSINOPHILS: 5 % (ref 0–5)
HCT: 38.3 % (ref 35.0–45.0)
HGB: 12.2 g/dL (ref 12.0–16.0)
LYMPHOCYTES: 39 % (ref 21–52)
MCH: 28.1 PG (ref 24.0–34.0)
MCHC: 31.9 g/dL (ref 31.0–37.0)
MCV: 88.2 FL (ref 74.0–97.0)
MONOCYTES: 6 % (ref 3–10)
MPV: 10.3 FL (ref 9.2–11.8)
NEUTROPHILS: 49 % (ref 40–73)
PLATELET: 275 10*3/uL (ref 135–420)
RBC: 4.34 M/uL (ref 4.20–5.30)
RDW: 14.8 % — ABNORMAL HIGH (ref 11.6–14.5)
WBC: 5.9 10*3/uL (ref 4.6–13.2)

## 2016-10-21 LAB — TYPE & SCREEN
ABO/Rh(D): O POS
Antibody screen: NEGATIVE

## 2016-10-21 LAB — SENTARA SPECIMEN COLLN.

## 2016-10-21 NOTE — Addendum Note (Signed)
Addended by: Gaynell Face on: 10/21/2016 02:17 PM      Modules accepted: Orders

## 2016-10-21 NOTE — Progress Notes (Signed)
Latarsha Zani, a 66 y.o. female,  is here for   Chief Complaint   Patient presents with   ??? Patient Education     surgical counseling       Visit Vitals   ??? BP 118/79 (BP 1 Location: Left arm, BP Patient Position: Sitting)   ??? Pulse (!) 120   ??? Temp 98.5 ??F (36.9 ??C) (Oral)   ??? Resp 18   ??? Ht 5\' 2"  (1.575 m)   ??? Wt 112.1 kg (247 lb 3.2 oz)   ??? SpO2 98%   ??? BMI 45.21 kg/m2       Patient reports having a headache since this morning, currently rated 6 out of 10, felt like a 9 out of 10 this morning, reports that she has not taken anything for pain or allergies, "feels like there is a lot of pressure on my head, neck, and face."  She followed up with her PCP her told her to use a nasal spray, she dislikes taking medication.  She does report that everyone in her house has had the flu for a week. She has an appointment with her PCP tomorrow at 1300. Will follow up.     Reviewed consent form for [facility] and information packet for a TLH/BSO and possible staging scheduled on 10/27/2016 at 1000 with an arrival time of 0800. Patient was given information packet that included pre-op and post-op instructions as well as the scheduled date, start time, arrival time, and length of the surgery and signed the consent form. Patient expressed understanding and had no further questions. Patient was encouraged to call if they have questions later.

## 2016-10-21 NOTE — Patient Instructions (Signed)
Laparoscopic Hysterectomy: Before Your Surgery  What is a laparoscopic hysterectomy?    A hysterectomy is surgery to take out the uterus. In some cases, the ovaries and fallopian tubes also are taken out at the same time.  The doctor makes one or more small cuts in the belly. These cuts are called incisions. They let the doctor insert tools to do the surgery. One of these tools is a tube with a light on it. It's called a laparoscope, or scope. The scope and the other tools allow the doctor to free the uterus. The doctor then removes the uterus through the small cuts.  In a total hysterectomy, the doctor takes out the uterus and the cervix. In a supracervical hysterectomy, only the uterus is taken out.  Most women go home in 1 to 2 days. You may need about 4 to 6 weeks to fully recover.  After the surgery, you will not have periods. You will not be able to get pregnant. If there is a chance that you will want to have a baby, talk to your doctor about other treatment options.  Your doctor may advise you to take hormone pills if your ovaries are removed. Your doctor will talk to you about the risks and benefits of hormones. He or she will also tell you how long to take them.  This surgery probably won't lower your interest in sex. In fact, some women enjoy sex more. This may be because they no longer have to worry about birth control or heavy bleeding.  Follow-up care is a key part of your treatment and safety. Be sure to make and go to all appointments, and call your doctor if you are having problems. It's also a good idea to know your test results and keep a list of the medicines you take.  What happens before surgery?  ?Surgery can be stressful. This information will help you understand what you can expect. And it will help you safely prepare for surgery.  ?Preparing for surgery  ? ?? Understand exactly what surgery is planned, along with the risks, benefits, and other options.     ?? Tell your doctors ALL the medicines, vitamins, supplements, and herbal remedies you take. Some of these can increase the risk of bleeding or interact with anesthesia.   ? ?? If you take blood thinners, such as warfarin (Coumadin), clopidogrel (Plavix), or aspirin, be sure to talk to your doctor. He or she will tell you if you should stop taking these medicines before your surgery. Make sure that you understand exactly what your doctor wants you to do.   ? ?? Your doctor will tell you which medicines to take or stop before your surgery. You may need to stop taking certain medicines a week or more before surgery. So talk to your doctor as soon as you can.   ? ?? If you have an advance directive, let your doctor know. It may include a living will and a durable power of attorney for health care. Bring a copy to the hospital. If you don't have one, you may want to prepare one. It lets your doctor and loved ones know your health care wishes. Doctors advise that everyone prepare these papers before any type of surgery or procedure.   What happens on the day of surgery?   ?? Follow the instructions exactly about when to stop eating and drinking. If you don't, your surgery may be canceled. If your doctor told you to take your medicines   on the day of surgery, take them with only a sip of water.   ? ?? Take a bath or shower before you come in for your surgery. Do not apply lotions, perfumes, deodorants, or nail polish.   ? ?? Do not shave the surgical site yourself.   ? ?? Take off all jewelry and piercings. And take out contact lenses, if you wear them.   ?At the hospital or surgery center   ?? Bring a picture ID.   ? ?? The area for surgery is often marked to make sure there are no errors.   ? ?? You will be kept comfortable and safe by your anesthesia provider. You will be asleep during the surgery.   ? ?? The surgery will take about 2 to 4 hours.   Going home   ?? Be sure you have someone to drive you home. Anesthesia and pain  medicine make it unsafe for you to drive.   ? ?? You will be given more specific instructions about recovering from your surgery. They will cover things like diet, wound care, follow-up care, driving, and getting back to your normal routine.   When should you call your doctor?   ?? You have questions or concerns.   ? ?? You don't understand how to prepare for your surgery.   ? ?? You become ill before the surgery (such as fever, flu, or a cold).   ? ?? You need to reschedule or have changed your mind about having the surgery.   Where can you learn more?  Go to http://www.healthwise.net/GoodHelpConnections.  Enter D130 in the search box to learn more about "Laparoscopic Hysterectomy: Before Your Surgery."  Current as of: April 12, 2015  Content Version: 11.4  ?? 2006-2017 [employer]. Care instructions adapted under license by [employer] (which disclaims liability or warranty for this information). If you have questions about a medical condition or this instruction, always ask your healthcare professional. [employer]wise, Inc[employer] disclaims any warranty or liability for your use of this information.       Laparoscopic Hysterectomy: What to Expect at Home  Your Recovery    A hysterectomy is surgery to take out the uterus. In somecases, the ovaries and fallopian tubes also are taken out at thesame time.  You can expect to feel better and stronger each day, but you may need pain medicine for a week or two. You may get tired easily or have less energy than usual. The tiredness may last for several weeks after surgery. You will probably notice that your belly is swollen and puffy. This is common. The swelling will take several weeks to go down. You may take about 4 to 6 weeks to fully recover.  It is important to avoid lifting while you are recovering so that you can heal.  This care sheet gives you a general idea about how long it will take for  you to recover. But each person recovers at a different pace. Follow the steps below to get better as quickly as possible.  How can you care for yourself at home?  Activity  ? ?? Rest when you feel tired.   ? ?? Be active. Walking is a good choice.   ? ?? Allow the area to heal. Don't move quickly or lift anything heavy until you are feeling better.   ? ?? You may shower 24 to 48 hours after surgery, if your doctor okays it. Pat the incision dry. Do not take a   bath for the first 2 weeks, or until your doctor tells you it is okay.   ? ?? Ask your doctor when it is okay for you to have sex.   Diet  ? ?? You can eat your normal diet. If your stomach is upset, try bland, low-fat foods like plain rice, broiled chicken, toast, and yogurt.   ? ?? If your bowel movements are not regular right after surgery, try to avoid constipation and straining. Drink plenty of water. Your doctor may suggest fiber, a stool softener, or a mild laxative.   Medicines  ? ?? Your doctor will tell you if and when you can restart your medicines. He or she will also give you instructions about taking any new medicines.   ? ?? If you take blood thinners, such as warfarin (Coumadin), clopidogrel (Plavix), or aspirin, be sure to talk to your doctor. He or she will tell you if and when to start taking those medicines again. Make sure that you understand exactly what your doctor wants you to do.   ? ?? Be safe with medicines. Read and follow all instructions on the label.  ?? If the doctor gave you a prescription medicine for pain, take it as prescribed.  ?? If you are not taking a prescription pain medicine, ask your doctor if you can take an over-the-counter medicine.   ? ?? If your doctor prescribed antibiotics, take them as directed. Do not stop taking them just because you feel better. You need to take the full course of antibiotics.   Incision care  ? ?? You may have stitches over the cuts (incisions) the doctor made in your belly.    ? ?? If you have strips of tape on the cut (incision) the doctor made, leave the tape on for a week or until it falls off.   ? ?? Wash the area daily with warm, soapy water, and pat it dry. Don't use hydrogen peroxide or alcohol. They can slow healing.   ? ?? You may cover the area with a gauze bandage if it oozes fluid or rubs against clothing.   ? ?? Change the bandage every day.   Other instructions  ? ?? You may have some light vaginal bleeding. Wear sanitary pads if needed. Do not douche or use tampons.   ? ?? Don't have sex until the doctor says it is okay.   Follow-up care is a key part of your treatment and safety. Be sure to make and go to all appointments, and call your doctor if you are having problems. It's also a good idea to know your test results and keep a list of the medicines you take.  When should you call for help?  Call 911 anytime you think you may need emergency care. For example, call if:  ? ?? You passed out (lost consciousness).   ? ?? You have chest pain, are short of breath, or cough up blood.   ?Call your doctor now or seek immediate medical care if:  ? ?? You have pain that does not get better after you take pain medicine.   ? ?? You cannot pass stoolsl or gas.   ? ?? You have vaginal discharge that has increased in amount or smells bad.   ? ?? You are sick to your stomach or cannot drink fluids.   ? ?? You have loose stitches, or your incision comes open.   ? ?? Bright red blood has soaked through the bandage   over your incision.   ? ?? You have signs of infection, such as:  ?? Increased pain, swelling, warmth, or redness.  ?? Red streaks leading from the incision.  ?? Pus draining from the incision.  ?? A fever.   ? ?? You have bright red vaginal bleeding that soaks one or more pads in an hour, or you have large clots.   ? ?? You have signs of a blood clot in your leg (called a deep vein thrombosis), such as:  ?? Pain in your calf, back of the knee, thigh, or groin.   ?? Redness and swelling in your leg or groin.   ?Watch closely for changes in your health, and be sure to contact your doctor if you have any problems.  Where can you learn more?  Go to http://www.healthwise.net/GoodHelpConnections.  Enter Q131 in the search box to learn more about "Laparoscopic Hysterectomy: What to Expect at Home."  Current as of: April 12, 2015  Content Version: 11.4  ?? 2006-2017 [employer]. Care instructions adapted under license by [employer] (which disclaims liability or warranty for this information). If you have questions about a medical condition or this instruction, always ask your healthcare professional. [employer] disclaims any warranty or liability for your use of this information.

## 2016-10-22 LAB — MIH FAX RESULT: FAX TO NUMBER: 4843408

## 2016-10-23 LAB — EKG, 12 LEAD, INITIAL
Atrial Rate: 111 {beats}/min
Calculated P Axis: 47 degrees
Calculated R Axis: 4 degrees
Calculated T Axis: 44 degrees
P-R Interval: 154 ms
Q-T Interval: 352 ms
QRS Duration: 76 ms
QTC Calculation (Bezet): 478 ms
Ventricular Rate: 111 {beats}/min

## 2016-10-24 NOTE — Other (Signed)
Unable to reach patient at this time; went straight to voice mail

## 2016-10-24 NOTE — Other (Signed)
Interviewed patient, questions answered. Patient is not sure if she is getting the flu as others in the household have it. She is going to her PCP tomorrow for exam and will have them fax notes to [place] for our record.

## 2016-10-25 ENCOUNTER — Inpatient Hospital Stay: Payer: MEDICAID | Primary: Family Medicine

## 2016-10-27 ENCOUNTER — Inpatient Hospital Stay: Payer: MEDICAID

## 2016-10-27 MED ORDER — NEOSTIGMINE METHYLSULFATE 3 MG/3 ML (1 MG/ML) IV SYRINGE
3 mg/ mL (1 mg/mL) | INTRAVENOUS | Status: AC
Start: 2016-10-27 — End: ?

## 2016-10-27 MED ORDER — SODIUM CHLORIDE 0.9 % INJECTION
25 mg/mL | Freq: Once | INTRAMUSCULAR | Status: DC
Start: 2016-10-27 — End: 2016-10-27

## 2016-10-27 MED ORDER — PHENYLEPHRINE 1 MG/10 ML (100 MCG/ML) IN NS IV SYRINGE
1 mg/0 mL (00 mcg/mL) | INTRAVENOUS | Status: AC
Start: 2016-10-27 — End: ?

## 2016-10-27 MED ORDER — DEXAMETHASONE SODIUM PHOSPHATE 4 MG/ML IJ SOLN
4 mg/mL | INTRAMUSCULAR | Status: DC | PRN
Start: 2016-10-27 — End: 2016-10-27
  Administered 2016-10-27: 15:00:00 via INTRAVENOUS

## 2016-10-27 MED ORDER — FENTANYL CITRATE (PF) 50 MCG/ML IJ SOLN
50 mcg/mL | INTRAMUSCULAR | Status: AC
Start: 2016-10-27 — End: ?

## 2016-10-27 MED ORDER — HYDROMORPHONE (PF) 2 MG/ML IJ SOLN
2 mg/mL | INTRAMUSCULAR | Status: AC
Start: 2016-10-27 — End: ?

## 2016-10-27 MED ORDER — DEXAMETHASONE SODIUM PHOSPHATE 4 MG/ML IJ SOLN
4 mg/mL | INTRAMUSCULAR | Status: AC
Start: 2016-10-27 — End: ?

## 2016-10-27 MED ORDER — BUPIVACAINE-EPINEPHRINE (PF) 0.5 %-1:200,000 IJ SOLN
0.5 %-1:200,000 | INTRAMUSCULAR | Status: AC
Start: 2016-10-27 — End: ?

## 2016-10-27 MED ORDER — ACETAMINOPHEN 1,000 MG/100 ML (10 MG/ML) IV
1000 mg/100 mL (10 mg/mL) | INTRAVENOUS | Status: AC
Start: 2016-10-27 — End: ?

## 2016-10-27 MED ORDER — SODIUM CHLORIDE 0.9 % IJ SYRG
INTRAMUSCULAR | Status: DC | PRN
Start: 2016-10-27 — End: 2016-10-28

## 2016-10-27 MED ORDER — MIDAZOLAM 1 MG/ML IJ SOLN
1 mg/mL | INTRAMUSCULAR | Status: AC
Start: 2016-10-27 — End: ?

## 2016-10-27 MED ORDER — FENTANYL CITRATE (PF) 50 MCG/ML IJ SOLN
50 mcg/mL | INTRAMUSCULAR | Status: DC | PRN
Start: 2016-10-27 — End: 2016-10-27
  Administered 2016-10-27: 14:00:00 via INTRAVENOUS

## 2016-10-27 MED ORDER — NALOXONE 0.4 MG/ML INJECTION
0.4 mg/mL | INTRAMUSCULAR | Status: DC | PRN
Start: 2016-10-27 — End: 2016-10-27

## 2016-10-27 MED ORDER — HEPARIN (PORCINE) 5,000 UNIT/ML IJ SOLN
5000 unit/mL | Freq: Once | INTRAMUSCULAR | Status: AC
Start: 2016-10-27 — End: 2016-10-27
  Administered 2016-10-27: 14:00:00 via SUBCUTANEOUS

## 2016-10-27 MED ORDER — BUPIVACAINE-EPINEPHRINE (PF) 0.5 %-1:200,000 IJ SOLN
0.5 %-1:200,000 | INTRAMUSCULAR | Status: DC | PRN
Start: 2016-10-27 — End: 2016-10-27
  Administered 2016-10-27: 15:00:00

## 2016-10-27 MED ORDER — GLYCOPYRROLATE 0.2 MG/ML IJ SOLN
0.2 mg/mL | INTRAMUSCULAR | Status: DC | PRN
Start: 2016-10-27 — End: 2016-10-27
  Administered 2016-10-27: 16:00:00 via INTRAVENOUS

## 2016-10-27 MED ORDER — GLYCOPYRROLATE 0.2 MG/ML IJ SOLN
0.2 mg/mL | INTRAMUSCULAR | Status: AC
Start: 2016-10-27 — End: ?

## 2016-10-27 MED ORDER — NEOSTIGMINE METHYLSULFATE 5 MG/5 ML (1 MG/ML) IV SYRINGE
5 mg/ mL (1 mg/mL) | INTRAVENOUS | Status: DC | PRN
Start: 2016-10-27 — End: 2016-10-27
  Administered 2016-10-27: 16:00:00 via INTRAVENOUS

## 2016-10-27 MED ORDER — HYDROCODONE-ACETAMINOPHEN 5 MG-325 MG TAB
5-325 mg | ORAL | Status: DC | PRN
Start: 2016-10-27 — End: 2016-10-28
  Administered 2016-10-27 – 2016-10-28 (×3): via ORAL

## 2016-10-27 MED ORDER — LIDOCAINE (PF) 20 MG/ML (2 %) IJ SOLN
20 mg/mL (2 %) | INTRAMUSCULAR | Status: AC
Start: 2016-10-27 — End: ?

## 2016-10-27 MED ORDER — SODIUM CHLORIDE 0.9 % IV PIGGY BACK
900 mg/6 mL | Freq: Once | INTRAVENOUS | Status: AC
Start: 2016-10-27 — End: 2016-10-27
  Administered 2016-10-27: 15:00:00 via INTRAVENOUS

## 2016-10-27 MED ORDER — GENTAMICIN IN SALINE (ISO-OSMOTIC) 120 MG/100 ML IV PIGGY BACK
120 mg/100 mL | Freq: Three times a day (TID) | INTRAVENOUS | Status: AC
Start: 2016-10-27 — End: 2016-10-28
  Administered 2016-10-28 (×2): via INTRAVENOUS

## 2016-10-27 MED ORDER — LIDOCAINE (PF) 20 MG/ML (2 %) IJ SOLN
20 mg/mL (2 %) | INTRAMUSCULAR | Status: DC | PRN
Start: 2016-10-27 — End: 2016-10-27
  Administered 2016-10-27: 14:00:00 via INTRAVENOUS

## 2016-10-27 MED ORDER — PHENYLEPHRINE 1 MG/10 ML (100 MCG/ML) IN NS IV SYRINGE
1 mg/0 mL (00 mcg/mL) | INTRAVENOUS | Status: DC | PRN
Start: 2016-10-27 — End: 2016-10-27
  Administered 2016-10-27 (×16): via INTRAVENOUS

## 2016-10-27 MED ORDER — LACTATED RINGERS IV
INTRAVENOUS | Status: DC
Start: 2016-10-27 — End: 2016-10-27

## 2016-10-27 MED ORDER — LACTATED RINGERS IV
INTRAVENOUS | Status: DC
Start: 2016-10-27 — End: 2016-10-28
  Administered 2016-10-27 (×2): via INTRAVENOUS

## 2016-10-27 MED ORDER — HYDROMORPHONE 2 MG/ML INJECTION SOLUTION
2 mg/mL | INTRAMUSCULAR | Status: DC | PRN
Start: 2016-10-27 — End: 2016-10-27

## 2016-10-27 MED ORDER — PHENYLEPHRINE 10 MG/ML INJECTION
10 mg/mL | INTRAMUSCULAR | Status: AC
Start: 2016-10-27 — End: ?

## 2016-10-27 MED ORDER — LACTATED RINGERS IV
INTRAVENOUS | Status: DC
Start: 2016-10-27 — End: 2016-10-27
  Administered 2016-10-27: 14:00:00 via INTRAVENOUS

## 2016-10-27 MED ORDER — PROPOFOL 10 MG/ML IV EMUL
10 mg/mL | INTRAVENOUS | Status: AC
Start: 2016-10-27 — End: ?

## 2016-10-27 MED ORDER — SODIUM CHLORIDE 0.9 % IJ SYRG
INTRAMUSCULAR | Status: DC | PRN
Start: 2016-10-27 — End: 2016-10-27

## 2016-10-27 MED ORDER — SODIUM CHLORIDE 0.9 % IRRIGATION SOLN
0.9 % | Status: DC | PRN
Start: 2016-10-27 — End: 2016-10-27
  Administered 2016-10-27: 15:00:00

## 2016-10-27 MED ORDER — FENTANYL CITRATE (PF) 50 MCG/ML IJ SOLN
50 mcg/mL | INTRAMUSCULAR | Status: DC | PRN
Start: 2016-10-27 — End: 2016-10-27

## 2016-10-27 MED ORDER — ACETAMINOPHEN 1,000 MG/100 ML (10 MG/ML) IV
1000 mg/100 mL (10 mg/mL) | INTRAVENOUS | Status: DC | PRN
Start: 2016-10-27 — End: 2016-10-27
  Administered 2016-10-27: 15:00:00 via INTRAVENOUS

## 2016-10-27 MED ORDER — PROPOFOL 10 MG/ML IV EMUL
10 mg/mL | INTRAVENOUS | Status: DC | PRN
Start: 2016-10-27 — End: 2016-10-27
  Administered 2016-10-27 (×2): via INTRAVENOUS

## 2016-10-27 MED ORDER — SODIUM CHLORIDE 0.9 % IJ SYRG
Freq: Three times a day (TID) | INTRAMUSCULAR | Status: DC
Start: 2016-10-27 — End: 2016-10-28
  Administered 2016-10-27 – 2016-10-28 (×3): via INTRAVENOUS

## 2016-10-27 MED ORDER — GLUCOSE 4 GRAM CHEWABLE TAB
4 gram | ORAL | Status: DC | PRN
Start: 2016-10-27 — End: 2016-10-27

## 2016-10-27 MED ORDER — GENTAMICIN IN SALINE (ISO-OSMOTIC) 120 MG/100 ML IV PIGGY BACK
120 mg/100 mL | Freq: Once | INTRAVENOUS | Status: AC
Start: 2016-10-27 — End: 2016-10-27
  Administered 2016-10-27: 15:00:00 via INTRAVENOUS

## 2016-10-27 MED ORDER — ROCURONIUM 10 MG/ML IV
10 mg/mL | INTRAVENOUS | Status: AC
Start: 2016-10-27 — End: ?

## 2016-10-27 MED ORDER — ALBUTEROL SULFATE 0.083 % (0.83 MG/ML) SOLN FOR INHALATION
2.5 mg /3 mL (0.083 %) | RESPIRATORY_TRACT | Status: DC | PRN
Start: 2016-10-27 — End: 2016-10-27

## 2016-10-27 MED ORDER — SODIUM CHLORIDE 0.9 % IV PIGGY BACK
900 mg/6 mL | Freq: Three times a day (TID) | INTRAVENOUS | Status: AC
Start: 2016-10-27 — End: 2016-10-28
  Administered 2016-10-27 – 2016-10-28 (×2): via INTRAVENOUS

## 2016-10-27 MED ORDER — FLUMAZENIL 0.1 MG/ML IV SOLN
0.1 mg/mL | INTRAVENOUS | Status: DC | PRN
Start: 2016-10-27 — End: 2016-10-27

## 2016-10-27 MED ORDER — ONDANSETRON (PF) 4 MG/2 ML INJECTION
4 mg/2 mL | INTRAMUSCULAR | Status: DC | PRN
Start: 2016-10-27 — End: 2016-10-27
  Administered 2016-10-27: 16:00:00 via INTRAVENOUS

## 2016-10-27 MED ORDER — HYDROMORPHONE (PF) 2 MG/ML IJ SOLN
2 mg/mL | INTRAMUSCULAR | Status: DC | PRN
Start: 2016-10-27 — End: 2016-10-27
  Administered 2016-10-27: 16:00:00 via INTRAVENOUS

## 2016-10-27 MED ORDER — ENOXAPARIN 40 MG/0.4 ML SUB-Q SYRINGE
40 mg/0.4 mL | SUBCUTANEOUS | Status: DC
Start: 2016-10-27 — End: 2016-10-28
  Administered 2016-10-28: 12:00:00 via SUBCUTANEOUS

## 2016-10-27 MED ORDER — ONDANSETRON (PF) 4 MG/2 ML INJECTION
4 mg/2 mL | INTRAMUSCULAR | Status: AC
Start: 2016-10-27 — End: ?

## 2016-10-27 MED ORDER — GLUCAGON 1 MG INJECTION
1 mg | INTRAMUSCULAR | Status: DC | PRN
Start: 2016-10-27 — End: 2016-10-27

## 2016-10-27 MED ORDER — ROCURONIUM 10 MG/ML IV
10 mg/mL | INTRAVENOUS | Status: DC | PRN
Start: 2016-10-27 — End: 2016-10-27
  Administered 2016-10-27 (×3): via INTRAVENOUS

## 2016-10-27 MED ORDER — MIDAZOLAM 1 MG/ML IJ SOLN
1 mg/mL | INTRAMUSCULAR | Status: DC | PRN
Start: 2016-10-27 — End: 2016-10-27
  Administered 2016-10-27: 14:00:00 via INTRAVENOUS

## 2016-10-27 MED ORDER — DEXTROSE 50% IN WATER (D50W) IV SYRG
INTRAVENOUS | Status: DC | PRN
Start: 2016-10-27 — End: 2016-10-27

## 2016-10-27 MED ORDER — LACTATED RINGERS IV
INTRAVENOUS | Status: DC | PRN
Start: 2016-10-27 — End: 2016-10-27
  Administered 2016-10-27: 15:00:00

## 2016-10-27 MED ORDER — DIPHENHYDRAMINE HCL 50 MG/ML IJ SOLN
50 mg/mL | INTRAMUSCULAR | Status: DC | PRN
Start: 2016-10-27 — End: 2016-10-27

## 2016-10-27 MED FILL — MIDAZOLAM 1 MG/ML IJ SOLN: 1 mg/mL | INTRAMUSCULAR | Qty: 2

## 2016-10-27 MED FILL — PROPOFOL 10 MG/ML IV EMUL: 10 mg/mL | INTRAVENOUS | Qty: 20

## 2016-10-27 MED FILL — PHENYLEPHRINE 10 MG/ML INJECTION: 10 mg/mL | INTRAMUSCULAR | Qty: 1

## 2016-10-27 MED FILL — DEXAMETHASONE SODIUM PHOSPHATE 4 MG/ML IJ SOLN: 4 mg/mL | INTRAMUSCULAR | Qty: 1

## 2016-10-27 MED FILL — GLYCOPYRROLATE 0.2 MG/ML IJ SOLN: 0.2 mg/mL | INTRAMUSCULAR | Qty: 2

## 2016-10-27 MED FILL — ONDANSETRON (PF) 4 MG/2 ML INJECTION: 4 mg/2 mL | INTRAMUSCULAR | Qty: 2

## 2016-10-27 MED FILL — LIDOCAINE (PF) 20 MG/ML (2 %) IJ SOLN: 20 mg/mL (2 %) | INTRAMUSCULAR | Qty: 10

## 2016-10-27 MED FILL — XYLOCAINE-MPF 20 MG/ML (2 %) INJECTION SOLUTION: 20 mg/mL (2 %) | INTRAMUSCULAR | Qty: 5

## 2016-10-27 MED FILL — FENTANYL CITRATE (PF) 50 MCG/ML IJ SOLN: 50 mcg/mL | INTRAMUSCULAR | Qty: 2

## 2016-10-27 MED FILL — BD POSIFLUSH NORMAL SALINE 0.9 % INJECTION SYRINGE: INTRAMUSCULAR | Qty: 10

## 2016-10-27 MED FILL — CLINDAMYCIN 900 MG/6 ML IV: 900 mg/6 mL | INTRAVENOUS | Qty: 6

## 2016-10-27 MED FILL — GENTAMICIN IN SALINE (ISO-OSMOTIC) 120 MG/100 ML IV PIGGY BACK: 120 mg/100 mL | INTRAVENOUS | Qty: 100

## 2016-10-27 MED FILL — MARCAINE-EPINEPHRINE (PF) 0.5 %-1:200,000 INJECTION SOLUTION: 0.5 %-1:200,000 | INTRAMUSCULAR | Qty: 30

## 2016-10-27 MED FILL — OFIRMEV 1,000 MG/100 ML (10 MG/ML) INTRAVENOUS SOLUTION: 1000 mg/100 mL (10 mg/mL) | INTRAVENOUS | Qty: 100

## 2016-10-27 MED FILL — PHENYLEPHRINE 1 MG/10 ML (100 MCG/ML) IN NS IV SYRINGE: 1 mg/0 mL (00 mcg/mL) | INTRAVENOUS | Qty: 26

## 2016-10-27 MED FILL — LACTATED RINGERS IV: INTRAVENOUS | Qty: 1000

## 2016-10-27 MED FILL — ROCURONIUM 10 MG/ML IV: 10 mg/mL | INTRAVENOUS | Qty: 5

## 2016-10-27 MED FILL — GLYCOPYRROLATE 0.2 MG/ML IJ SOLN: 0.2 mg/mL | INTRAMUSCULAR | Qty: 3

## 2016-10-27 MED FILL — HYDROMORPHONE (PF) 2 MG/ML IJ SOLN: 2 mg/mL | INTRAMUSCULAR | Qty: 1

## 2016-10-27 MED FILL — ROCURONIUM 10 MG/ML IV: 10 mg/mL | INTRAVENOUS | Qty: 6

## 2016-10-27 MED FILL — HYDROCODONE-ACETAMINOPHEN 5 MG-325 MG TAB: 5-325 mg | ORAL | Qty: 1

## 2016-10-27 MED FILL — NEOSTIGMINE METHYLSULFATE 3 MG/3 ML (1 MG/ML) IV SYRINGE: 3 mg/ mL (1 mg/mL) | INTRAVENOUS | Qty: 3

## 2016-10-27 MED FILL — PHENYLEPHRINE 1 MG/10 ML (100 MCG/ML) IN NS IV SYRINGE: 1 mg/0 mL (00 mcg/mL) | INTRAVENOUS | Qty: 10

## 2016-10-27 MED FILL — LACTATED RINGERS IV: INTRAVENOUS | Qty: 900

## 2016-10-27 MED FILL — HEPARIN (PORCINE) 5,000 UNIT/ML IJ SOLN: 5000 unit/mL | INTRAMUSCULAR | Qty: 1

## 2016-10-27 NOTE — Op Note (Signed)
[place]  MR#: [id number]  DOB: 10/30/1950  ACCOUNT #: [id number]   DATE OF SERVICE: 10/27/2016    PREOPERATIVE DIAGNOSIS:  Grade II endometrial cancer.    POSTOPERATIVE DIAGNOSIS:  Grade II endometrial cancer.    PROCEDURE PERFORMED:  Robotic assisted total laparoscopic hysterectomy, bilateral salpingo-oophorectomy, bilateral pelvic lymph node dissection, washing.    SURGEON:  Moss Mc, DO    ASSISTANT:  Wynn Maudlin.      ANESTHESIA:  General.    FLUIDS:  2000 mL crystalloid.    URINE OUTPUT:  375 mL clear urine.    ESTIMATED BLOOD LOSS:  100 mL.    COMPLICATIONS:  None.    SPECIMENS REMOVED:  Uterus, cervix, bilateral tubes and ovaries, right pelvic lymph nodes, left pelvic lymph nodes, washings.    COMPLICATIONS:  None.    IMPLANTS:  none.      FINDINGS:  Laparoscopic findings revealed normal uterus, tubes and ovaries bilaterally.  All peritoneal surfaces were smooth.  Frozen section consistent with approximately 50% invasion.    INDICATION:  The patient is a 66 year old who initially had developed postmenopausal bleeding and was found to have a thickened endometrium.  She had an endometrial biopsy done revealing a grade II endometrial cancer.  She was initially seen in October and was lost to follow up because she had no insurance; however, she represented after getting insurance and presents today for definitive surgical management.    DESCRIPTION OF PROCEDURE:  The patient was taken to operating room where general anesthesia was obtained without difficulty.  She was placed in dorsal lithotomy position, prepped and draped in a sterile fashion.  A surgical timeout was taken by the entire team.  A Foley catheter was placed.  Sterile speculum was then placed in the patient's vagina.  Anterior lip of the cervix was grasped with single tooth tenaculum.  The cervix was then dilated.  An 8 cm RUMI tip with a 3.0 cm KOH cup was  advanced without difficulty.  Attention was then turned to the abdomen where a Veress needle introduced in the umbilicus.  Pneumoperitoneum was obtained to 15 mmHg.  An incision was then made above the umbilicus and an 8 mm robotic trocar was advanced.  Laparoscope was introduced and there was no evidence of injury from entry.  The patient was placed in steep Trendelenburg with findings above.  At this point, one 8 mm robotic trocar was placed in the left side of the abdomen under direct visualization and an 8 mm robotic trocar placed in the right side and under direct visualization, a 5 mm trocar was placed in the right lower quadrant under direct visualization.  Washings were then obtained.  The robot was then docked to the console.  Attention was turned to the right side where the right round was grasped and transected using monopolar cautery.  This peritoneal incision was extended superiorly and along the right IP ligament.  The right retroperitoneal space was opened.  The ureter was identified.  A window in the peritoneum overlying the ureter was made and the right IP was skeletonized, sealed, and divided.  The adnexa was elevated and the posteromedial leaf of the broad ligament was incised towards the KOH cup.  This was then repeated on the left side in the same fashion without difficulty.      Next, the uterus was retroverted and the anterior vesicouterine peritoneum was identified and incise along the uterine segment.  The bladder flap was then created using blunt and sharp dissection.  Uterine arteries were then skeletonized bilaterally, sealed and divided.  The cardinal ligaments were then sealed and divided.  An anterior colpotomy was then made and this was carried circumferentially around the KOH cup and the specimen was pulled through the vagina and sent for frozen section as above.  At this point, attention was turned to the right-sided pelvic lymph node dissection where  the right round ligament was elevated and the underlying lymphatic bundle was dissected free off the psoas muscle medial to the external artery and vein from the bifurcation circumflex iliac vein.  The right paravesical space was then developed and this dissection was carried deep to the obturator nerve and then cephalad to the bifurcation.  The specimen was placed in EndoCatch bag and sent for permanent specimen.  This was then carried out on the left side in the same fashion with the same borders.  Next, the vaginal cuff was closed with 2-0 PDS in a running fashion.  The pelvis was irrigated copiously.  FloSeal was injected into the bilateral pelvic lymph node resection bag.  At this point, all instruments were removed.  The robot was then docked.  All trocars were removed and pneumoperitoneum was relieved.  All skin sites were closed with 4-0 Monocryl, 0.5% Marcaine was injected at the conclusion.  The patient tolerated procedure well.  Sponge and needle correct x2 and the patient taken to recovery room in stable condition.      Lorilei Horan, DO       CM / DN  D: 10/27/2016 12:16     T: 10/27/2016 13:24  JOB #: [id number]

## 2016-10-27 NOTE — Other (Signed)
Verbal hand off at the bedside with Burman Nieves, RN provided opportunity for questions and dual skin assessment performed.

## 2016-10-27 NOTE — Anesthesia Post-Procedure Evaluation (Signed)
Post-Anesthesia Evaluation & Assessment    Visit Vitals   ??? BP 126/70   ??? Pulse 89   ??? Temp 36.5 ??C (97.7 ??F)   ??? Resp 13   ??? Ht _0  (1.626 m)   ??? Wt 110.9 kg (244 lb 8 oz)   ??? SpO2 100%   ??? BMI 41.97 kg/m2       No untreated/active PONV    Post-operative hydration adequate.    Adequate post-operative analgesia per PACU discharge criteria    Mental status & level of consciousness: alert and oriented x 3    Respiratory status: patent unassisted airway     No apparent anesthetic complications requiring additional post-anesthetic care    Patient has met all discharge requirements.            Nanine Means, CRNA

## 2016-10-27 NOTE — Interval H&P Note (Signed)
H&P Update:  [name] was seen and examined.  History and physical has been reviewed. The patient has been examined. There have been no significant clinical changes since the completion of the originally dated History and Physical.    Signed By: Patsi Sears, MD     October 27, 2016 10:12 AM

## 2016-10-27 NOTE — Other (Signed)
TRANSFER - OUT REPORT:    Verbal report given to [place] (name) on Vesper Trant  being transferred to 3 S(unit) for routine progression of care       Report consisted of patient???s Situation, Background, Assessment and   Recommendations(SBAR).     Information from the following report(s) SBAR, Kardex and OR Summary was reviewed with the receiving nurse.    Lines:   Peripheral IV 10/27/16 Right Hand (Active)   Site Assessment Clean, dry, & intact 10/27/2016 10:58 AM   Phlebitis Assessment 0 10/27/2016 10:58 AM   Infiltration Assessment 0 10/27/2016 10:58 AM   Dressing Status Clean, dry, & intact 10/27/2016 10:58 AM   Dressing Type Transparent 10/27/2016 10:58 AM   Hub Color/Line Status Patent 10/27/2016 10:58 AM   Alcohol Cap Used No 10/27/2016  9:26 AM       Peripheral IV 10/27/16 Left Hand (Active)   Site Assessment Clean, dry, & intact 10/27/2016 10:58 AM   Phlebitis Assessment 0 10/27/2016 10:58 AM   Infiltration Assessment 0 10/27/2016 10:58 AM   Dressing Status Clean, dry, & intact 10/27/2016 10:58 AM   Dressing Type Transparent 10/27/2016 10:58 AM   Hub Color/Line Status Patent 10/27/2016 10:58 AM   Alcohol Cap Used No 10/27/2016  9:49 AM        Opportunity for questions and clarification was provided.      Patient transported with:   O2 @ 2 liters  Registered Nurse

## 2016-10-27 NOTE — Brief Op Note (Signed)
BRIEF OPERATIVE NOTE    Date of Procedure: 10/27/2016   Preoperative Diagnosis: ENDOMETRIAL CANCER  Postoperative Diagnosis: ENDOMETRIAL CANCER    Procedure(s):  ROBOTIC TOTAL LAPAROSCOPIC HYSTERECTOMY,BILATERAL SALPINGO OOPHORECTOMY, STAGING  Surgeon(s) and Role:     * Patsi Sears, MD - Primary         Surgical Assistant: Wynn Maudlin    Surgical Staff:  Circ-1: Crawford Givens  Circ-Relief: Geralyn Corwin, RN  Scrub Tech-1: Esperanza Richters  Surg Asst-1: Boris Sharper  Event Time In   Incision Start 1045   Incision Close      Anesthesia: General   Estimated Blood Loss: 100  Specimens:   ID Type Source Tests Collected by Time Destination   1 : uterus, cervix, fallopian tubes, ovaries Frozen Section Uterus with Bilateral Fallopian tubes and Ovaries  Patsi Sears, MD 10/27/2016 1114 Pathology   2 : left pelvic lymph nodes Preservative Lymph Node  Patsi Sears, MD 10/27/2016 1139 Pathology   3 : right pelvic lymph nodes Preservative Lymph Node  Patsi Sears, MD 10/27/2016 1139 Pathology   1 : pelvic washings Fresh Pelvis  Patsi Sears, MD 10/27/2016 1101 Cytology      Findings: normal uterus, tubes, ovaries. Frozen with approximately 39% invasion   Complications: none  Implants: * No implants in log *

## 2016-10-27 NOTE — Progress Notes (Signed)
TRANSFER - IN REPORT:    Verbal report received from Elena,RN(name) on [place] [name]  being received from [employer]) for routine post - op      Report consisted of patient???s Situation, Background, Assessment and   Recommendations(SBAR).     Information from the following report(s) SBAR, Kardex, Procedure Summary, Intake/Output, MAR and Recent Results was reviewed with the receiving nurse.    Opportunity for questions and clarification was provided.      Assessment  Will be completed upon patient???s arrival to unit and care assumed.

## 2016-10-27 NOTE — Discharge Summary (Signed)
Dual skin assessment completed with Ilda Mori, RN.

## 2016-10-27 NOTE — Progress Notes (Signed)
Pt received via stretcher. Primary Nurse Loney Laurence, RN and Alonna Minium, RN performed a dual skin assessment on this patient No impairment noted other than 3 lap sites with band aid. Pt was oriented to room, call bell and remote. IS and teaching/demo  provided with repeat back. SCD's, placed ice chips provided. Pt states pain @ 7/10 surgical site. Daughter at the bedside for support. Will re assess and continue to provide care.  Braden score is 23

## 2016-10-27 NOTE — Other (Signed)
Updated family with patient status and that patient will be going to room #329

## 2016-10-27 NOTE — Anesthesia Pre-Procedure Evaluation (Signed)
Anesthetic History   No history of anesthetic complications            Review of Systems / Medical History  Patient summary reviewed, nursing notes reviewed and pertinent labs reviewed    Pulmonary  Within defined limits                 Neuro/Psych   Within defined limits           Cardiovascular                  Exercise tolerance: >4 METS     GI/Hepatic/Renal     GERD: well controlled           Endo/Other        Morbid obesity     Other Findings              Physical Exam    Airway  Mallampati: II  TM Distance: 4 - 6 cm  Neck ROM: normal range of motion   Mouth opening: Normal     Cardiovascular  Regular rate and rhythm,  S1 and S2 normal,  no murmur, click, rub, or gallop             Dental      Comments: Numerous missing teeth   Pulmonary  Breath sounds clear to auscultation               Abdominal  GI exam deferred       Other Findings            Anesthetic Plan    ASA: 3  Anesthesia type: general          Induction: Intravenous  Anesthetic plan and risks discussed with: Patient

## 2016-10-28 LAB — METABOLIC PANEL, BASIC
Anion gap: 9 mmol/L (ref 3.0–18)
BUN/Creatinine ratio: 8 — ABNORMAL LOW (ref 12–20)
BUN: 7 MG/DL (ref 7.0–18)
CO2: 26 mmol/L (ref 21–32)
Calcium: 8.5 MG/DL (ref 8.5–10.1)
Chloride: 109 mmol/L — ABNORMAL HIGH (ref 100–108)
Creatinine: 0.9 MG/DL (ref 0.6–1.3)
GFR est AA: >60 mL/min/{1.73_m2} (ref >60)
GFR est non-AA: >60 mL/min/{1.73_m2} (ref >60)
Glucose: 129 mg/dL — ABNORMAL HIGH (ref 74–99)
Potassium: 4.1 mmol/L (ref 3.5–5.5)
Sodium: 144 mmol/L (ref 136–145)

## 2016-10-28 LAB — CBC WITH AUTOMATED DIFF
ABS. BASOPHILS: 0 10*3/uL (ref 0.0–0.06)
ABS. EOSINOPHILS: 0 10*3/uL (ref 0.0–0.4)
ABS. LYMPHOCYTES: 2 10*3/uL (ref 0.9–3.6)
ABS. MONOCYTES: 0.7 10*3/uL (ref 0.05–1.2)
ABS. NEUTROPHILS: 5.6 10*3/uL (ref 1.8–8.0)
BASOPHILS: 0 % (ref 0–2)
EOSINOPHILS: 0 % (ref 0–5)
HCT: 31.7 % — ABNORMAL LOW (ref 35.0–45.0)
HGB: 10.1 g/dL — ABNORMAL LOW (ref 12.0–16.0)
LYMPHOCYTES: 24 % (ref 21–52)
MCH: 28 PG (ref 24.0–34.0)
MCHC: 31.9 g/dL (ref 31.0–37.0)
MCV: 87.8 FL (ref 74.0–97.0)
MONOCYTES: 8 % (ref 3–10)
MPV: 9.9 FL (ref 9.2–11.8)
NEUTROPHILS: 68 % (ref 40–73)
PLATELET: 253 10*3/uL (ref 135–420)
RBC: 3.61 M/uL — ABNORMAL LOW (ref 4.20–5.30)
RDW: 14.9 % — ABNORMAL HIGH (ref 11.6–14.5)
WBC: 8.3 10*3/uL (ref 4.6–13.2)

## 2016-10-28 MED ORDER — HYDROCODONE-ACETAMINOPHEN 5 MG-325 MG TAB
5-325 mg | ORAL_TABLET | ORAL | 0 refills | Status: DC | PRN
Start: 2016-10-28 — End: 2016-11-12

## 2016-10-28 MED FILL — LOVENOX 40 MG/0.4 ML SUBCUTANEOUS SYRINGE: 40 mg/0.4 mL | SUBCUTANEOUS | Qty: 0.4

## 2016-10-28 MED FILL — HYDROCODONE-ACETAMINOPHEN 5 MG-325 MG TAB: 5-325 mg | ORAL | Qty: 2

## 2016-10-28 MED FILL — HYDROCODONE-ACETAMINOPHEN 5 MG-325 MG TAB: 5-325 mg | ORAL | Qty: 1

## 2016-10-28 NOTE — Progress Notes (Signed)
Bedside and Verbal shift change report given to K Cruz,RN (oncoming nurse) by Loney Laurence, RN   (offgoing nurse). Report included the following information SBAR, Kardex, Intake/Output, MAR and Recent Results.

## 2016-10-28 NOTE — Progress Notes (Addendum)
Patient educated on foley catheter removal and importance regarding ability to void post removal.  Patient agrees and foley catheter removed.  Patient ambulated x 1 in hallway.  Required Norco x 1 tab po for pain to abdomen rating at 6/10, which provided relief.  Patient states has been passing flatus and burping, but would like to have bowel movement.  Patient given prune juice 480 ml.  Lap sites x 4 intact, bandaides CDI.  Hypoactive bowel sounds. Anticipating discharge today.    Patient Vitals for the past 12 hrs:   Temp Pulse Resp BP SpO2   10/28/16 0322 - 95 - 100/52 -   10/27/16 2246 98.5 ??F (36.9 ??C) 95 16 96/55 95 %

## 2016-10-28 NOTE — Progress Notes (Signed)
PT IS BEING DISCHARGED HOME TODAY.  DISCHARGE INSTRUCTIONS GIVEN.  PT REQUESTED PAIN MED SINCE SHE HAS TO TRAVEL A DISTANCE.  DILAUDID GIVEN.

## 2016-10-28 NOTE — Progress Notes (Addendum)
Transition of Care (TOC) Plan:          Pt admitted for an elective surgical procedure, HYSTERECTOMY.  Pt has hxendometrioid adenocarcinoma. Pt is independent.  Please encourage ambulation.  No plan of care needs identified.  Anticipate pt will be medically stable for discharge within the next 24-48 hours.  CM available to assist as needed.        TOC Transportation:   How is patient being transported at discharge? Family/Friend      When? Once cleared by physician     Is transport scheduled? N/A      Follow-up appointment and transportation:   PCP/Specialist?  See AVS for Appointment         Who is transporting to the follow-up appointment? Self/Family/Friend      Is transport for follow up appointment scheduled? N/A    Communication plan (with patient/family):    Who is being called?  Patient or Next of Kin?  Responsible party?     Patient      What number(s) is to be used?  See Facesheet      What service provider is calling for [facility] services?              When are they calling?      Readmission Risk?  (Green/Low; Yellow/Moderate; Red/High):  Green      1020  Met with patient at bedside.  NP Ria Comment at bedside discussing discharge plan with pt.  Pt will dc today. Pt states she will discharge to her daughter, Darrel [facility] house in [place].  Pt will have family support at discharge.  Pt denies using any DME.  Pt has no concerns pertaining to discharge.  CM will cont to be available, as needed.

## 2016-10-28 NOTE — Progress Notes (Signed)
Oral and Written notification given to patient and/or caregiver informing them that they are currently an Outpatient receiving care in our facility.  Outpatient services include Observ[facility] Services under [facility] and [facility] requirements.

## 2016-10-28 NOTE — Progress Notes (Signed)
PT WAS RECEIVED THIS AM LYING IN BED.  SHE WAS ALERT AND ORIENTED.  BED IS LOW,  SIDE RAILS UP X 2 AND CALL LIGHT IN REACH.   LUNGS- CLEAR.  HEART - RRR.  ABDOMEN IS TENDER WITH 4 LAP SITES NOTED THAT ARE CLEAN AND DRY.  EXT-  NO EDEMA.   FRIEND AT BEDSIDE.

## 2016-10-28 NOTE — Other (Signed)
Bedside and Verbal shift change report given to M.Grassi,RN (Soil scientist) by Dutch Quint (offgoing nurse). Report included the following information SBAR, Kardex, Intake/Output and MAR.

## 2016-10-28 NOTE — Progress Notes (Signed)
Problem: Falls - Risk of  Goal: *Absence of Falls  Document Schmid Fall Risk and appropriate interventions in the flowsheet.   Outcome: Progressing Towards Goal  Fall Risk Interventions:            Medication Interventions: Patient to call before getting OOB         History of Falls Interventions: Investigate reason for fall

## 2016-10-28 NOTE — Progress Notes (Signed)
PT IS RESTING IN BED, WATCHING TV.  SHE OFFERS NO COMPLAINTS.  BED IS LOW,  SIDE RAILS UP X 2 AND CALL LIGHT IN REACH.

## 2016-10-28 NOTE — Discharge Summary (Signed)
GYN Discharge Summary                        Patient ID:  [name]  66 y.o.                                                                 Admission Date: 10/27/2016  Discharge Date: 10/28/16                                                 Attending: Patsi Sears, MD   PCP: Christa See, MD                                                                                               Reason for admission:Grade II endometrial cancer.  ??  Discharge  diagnosis: Active Problems:    Fibroid (10/27/2016)        Associated Conditions:        Patient Active Problem List   Diagnosis Code   ??? Obesity, morbid ([place]) E66.01   ??? Fibroid D25.9              Past Medical History:   Diagnosis Date   ??? Borderline diabetes mellitus    ??? Cancer ([place]) 09/2016    Uterine   ??? GERD (gastroesophageal reflux disease)     occasional, takes Prilosec prn       Operative Procedure: Robotic assisted total laparoscopic hysterectomy, bilateral salpingo-oophorectomy, bilateral pelvic lymph node dissection, washing    Complications:  none                   Transfusion:  none    Hospital Course: uncomplicated    Condition at discharge: good, stable, Afebrile, Ambulating and Eating, Drinking, Voiding    Labs:  Lab Results   Component Value Date/Time    WBC 8.3 10/28/2016 06:16 AM    HGB 10.1 (L) 10/28/2016 06:16 AM    HCT 31.7 (L) 10/28/2016 06:16 AM    PLATELET 253 10/28/2016 06:16 AM    MCV 87.8 10/28/2016 06:16 AM     Lab Results   Component Value Date/Time    Sodium 144 10/28/2016 06:16 AM    Potassium 4.1 10/28/2016 06:16 AM    Chloride 109 (H) 10/28/2016 06:16 AM    CO2 26 10/28/2016 06:16 AM    Anion gap 9 10/28/2016 06:16 AM    Glucose 129 (H) 10/28/2016 06:16 AM    BUN 7 10/28/2016 06:16 AM    Creatinine 0.90 10/28/2016 06:16 AM    BUN/Creatinine ratio 8 (L) 10/28/2016 06:16 AM    GFR est AA >60 10/28/2016 06:16 AM    GFR est non-AA >60 10/28/2016 06:16 AM  Current Discharge Medication List       START taking these medications    Details   HYDROcodone-acetaminophen (NORCO) 5-325 mg per tablet Take 1-2 Tabs by mouth every four (4) hours as needed. Max Daily Amount: 12 Tabs. Indications: Pain  Qty: 60 Tab, Refills: 0    Associated Diagnoses: Postoperative pain         CONTINUE these medications which have NOT CHANGED    Details   cholecalciferol (VITAMIN D3) 1,000 unit tablet 1,000 Units.      ferrous sulfate 325 mg (65 mg iron) tablet 325 mg.      multivitamin (ONE A DAY) tablet Take 1 Tab by mouth daily.      CYANOCOBALAMIN, VITAMIN B-12, (VITAMIN B12 PO) Take 1 Tab by mouth.               Patient Instructions:        Disposition:  Home    Follow-up:  Follow up with Dr. Verner Mould  in 4 weeks    Author:   [facility]  Gynecologic Oncology  10/28/2016  10:51 AM

## 2016-11-03 ENCOUNTER — Emergency Department: Admit: 2016-11-03 | Payer: MEDICAID | Primary: Family Medicine

## 2016-11-03 ENCOUNTER — Inpatient Hospital Stay
Admit: 2016-11-03 | Discharge: 2016-11-04 | Disposition: A | Discharge status: Home / Self Care | Payer: MEDICAID | Attending: Emergency Medicine

## 2016-11-03 DIAGNOSIS — I10 Essential (primary) hypertension: Secondary | ICD-10-CM

## 2016-11-03 LAB — CBC WITH AUTOMATED DIFF
ABS. BASOPHILS: 0 10*3/uL
ABS. EOSINOPHILS: 0.2 10*3/uL
ABS. LYMPHOCYTES: 2.6 10*3/uL
ABS. MONOCYTES: 0.4 10*3/uL
ABS. NEUTROPHILS: 3.3 10*3/uL
BASOPHILS: 1 % (ref 0–1)
EOSINOPHILS: 3 % (ref 0–5)
HCT: 39.5 %
HGB: 12.6 g/dL (ref 12.0–16.0)
LYMPHOCYTES: 40 % (ref 12–49)
MCH: 28.1 PG (ref 25.0–35.0)
MCHC: 31.9 g/dL (ref 31.0–37.0)
MCV: 88.2 FL (ref 78.0–102.0)
MONOCYTES: 6 % (ref 5–13)
MPV: 10.6 FL — ABNORMAL HIGH (ref 7.4–10.4)
NEUTROPHILS: 50 % (ref 42–75)
PLATELET: 348 10*3/uL
RBC: 4.48 M/uL
RDW: 15 % — ABNORMAL HIGH (ref 11.5–14.5)
WBC: 6.6 10*3/uL

## 2016-11-03 LAB — METABOLIC PANEL, COMPREHENSIVE
A-G Ratio: 0.6 — ABNORMAL LOW (ref 1.1–2.2)
ALT (SGPT): 50 U/L (ref 30–65)
AST (SGOT): 62 U/L — ABNORMAL HIGH (ref 15–37)
Albumin: 3.2 g/dL — ABNORMAL LOW (ref 3.5–5.0)
Alk. phosphatase: 149 U/L — ABNORMAL HIGH (ref 50–136)
Anion gap: 10 mmol/L (ref 5–15)
BUN/Creatinine ratio: 14 (ref 12–20)
BUN: 11 MG/DL (ref 6–20)
Bilirubin, total: 0.2 MG/DL (ref <1.0)
CO2: 27 mmol/L (ref 21–32)
Calcium: 9.4 MG/DL (ref 8.5–10.1)
Chloride: 105 mmol/L (ref 97–108)
Creatinine: 0.81 MG/DL
Globulin: 5 g/dL — ABNORMAL HIGH (ref 2.0–4.0)
Glucose: 110 mg/dL — ABNORMAL HIGH (ref 50–100)
Potassium: 4.4 mmol/L (ref 3.5–5.1)
Protein, total: 8.2 g/dL (ref 6.4–8.2)
Sodium: 142 mmol/L (ref 136–145)

## 2016-11-03 LAB — URINALYSIS W/ RFLX MICROSCOPIC
Bilirubin: NEGATIVE
Blood: NEGATIVE
Glucose: NEGATIVE mg/dL
Ketone: NEGATIVE mg/dL
Leukocyte Esterase: NEGATIVE
Nitrites: NEGATIVE
Protein: NEGATIVE mg/dL
Specific gravity: 1.003 (ref 1.003–1.030)
Urobilinogen: 0.2 EU/dL (ref 0.2–1.0)
pH (UA): 8 (ref 5.0–8.0)

## 2016-11-03 LAB — D DIMER: D DIMER: 3.61 ug/ml(FEU)

## 2016-11-03 LAB — CARDIAC PANEL,(CK, CKMB & TROPONIN)
CK - MB: 0.5 ng/ml (ref 0.0–5.0)
CK-MB Index: 0.8 % (ref 0.0–4.0)
CK: 59 U/L (ref 21–215)
Troponin-I, QT: <0.02 NG/ML — ABNORMAL LOW (ref 0.07–1.5)

## 2016-11-03 MED ORDER — TECHNETIUM TC 99M PENTETATE AEROSOL
Freq: Once | Status: AC
Start: 2016-11-03 — End: 2016-11-03
  Administered 2016-11-03: 23:00:00 via RESPIRATORY_TRACT

## 2016-11-03 MED ORDER — TECHNETIUM TO 99M ALBUMIN AGGREGATED
Freq: Once | Status: AC
Start: 2016-11-03 — End: 2016-11-03
  Administered 2016-11-03: 23:00:00 via INTRAVENOUS

## 2016-11-03 MED ORDER — SODIUM CHLORIDE 0.9% BOLUS IV
0.9 % | Freq: Once | INTRAVENOUS | Status: AC
Start: 2016-11-03 — End: 2016-11-03
  Administered 2016-11-03: 18:00:00 via INTRAVENOUS

## 2016-11-03 MED FILL — SODIUM CHLORIDE 0.9 % IV: INTRAVENOUS | Qty: 1000

## 2016-11-03 NOTE — ED Triage Notes (Signed)
Pt presents to ED c/o high blood pressure and "thick feeling left side of mouth".    Sepsis Screening completed    (xx  )Patient meets SIRS criteria.  (  )Patient does not meet SIRS criteria.      SIRS Criteria is achieved when two or more of the following are present  ? Temperature < 96.8??F (36??C) or > 100.9??F (38.3??C)  ? Heart Rate > 90 beats per minute  ? Respiratory Rate > 20 breaths per minute  ? WBC count > 12,000 or <4,000 or > 10% bands

## 2016-11-03 NOTE — ED Notes (Signed)
Pt d/c off the board by staff prior to pt being d/c home.     Reviewed d/c paperwork with pt/understanding acknowledged by pt. Pt ambulatory. No pain issues voiced. Pt leaving with family.  NAD observed.    BP 122/90 upon d/c home.

## 2016-11-03 NOTE — ED Notes (Signed)
Pt given menu and instructed on how room service works.

## 2016-11-03 NOTE — ED Notes (Signed)
Assumed care of pt from [place], [place]. Have been informed that pt present to ED with HTN.  Pt back from Nuclear scan.  Awaits results.  Told orders complete.

## 2016-11-03 NOTE — ED Notes (Signed)
Assisted pt to restroom.

## 2016-11-03 NOTE — ED Notes (Signed)
Observe pt out of room to CT.

## 2016-11-03 NOTE — ED Provider Notes (Signed)
EMERGENCY DEPARTMENT HISTORY AND PHYSICAL EXAM    Date: 11/03/2016  Patient Name: [name]    History of Presenting Illness     Chief Complaint   Patient presents with   ??? Hypertension         History Provided By: Patient    Chief Complaint: Left shoulder heaviness  Duration: 4 Hours  Timing:  Acute  Location: Left shoulder radiating to left mouth and left upper chest  Quality: Heaviness  Associated Symptoms: LUQ heaviness, dizziness, and hypertension    Additional History (Context):   1:57 PM  [name] is a 66 y.o. female with PMHx of GERD, uterine cancer and borderline DM who presents to the emergency department C/O left shoulder heaviness radiating to her left mouth and left upper chest onset 4 hours ago. Associated sxs include LUQ heaviness, dizziness, and hypertension. Last known normal was 4 hours ago. Pt had a hysterectomy 1 week ago. No daily medications. Pt denies numbness, weakness, chest pain, SOB, illicit drug use, EtOH use, tobacco use, and any other sxs or complaints.     PCP: Christa See, MD    Current Outpatient Prescriptions   Medication Sig Dispense Refill   ??? HYDROcodone-acetaminophen (NORCO) 5-325 mg per tablet Take 1-2 Tabs by mouth every four (4) hours as needed. Max Daily Amount: 12 Tabs. Indications: Pain 60 Tab 0   ??? cholecalciferol (VITAMIN D3) 1,000 unit tablet 1,000 Units.     ??? ferrous sulfate 325 mg (65 mg iron) tablet 325 mg.     ??? multivitamin (ONE A DAY) tablet Take 1 Tab by mouth daily.     ??? CYANOCOBALAMIN, VITAMIN B-12, (VITAMIN B12 PO) Take 1 Tab by mouth.         Past History     Past Medical History:  Past Medical History:   Diagnosis Date   ??? Borderline diabetes mellitus    ??? Cancer ([place]) 09/2016    Uterine   ??? GERD (gastroesophageal reflux disease)     occasional, takes Prilosec prn       Past Surgical History:  Past Surgical History:   Procedure Laterality Date   ??? HX ORTHOPAEDIC Right     age 68   ??? HX OTHER SURGICAL Right      knee   ??? HX OTHER SURGICAL Right     foot    ??? HX TONSILLECTOMY         Family History:  Family History   Problem Relation Age of Onset   ??? Breast Cancer Mother    ??? Diabetes Mother    ??? Heart Disease Mother    ??? Diabetes Other      family hx of   ??? Heart Disease Maternal Grandmother        Social History:  Social History   Substance Use Topics   ??? Smoking status: Former Smoker   ??? Smokeless tobacco: Never Used      Comment: quit over 30 years ago   ??? Alcohol use No       Allergies:  Allergies   Allergen Reactions   ??? Aspirin Shortness of Breath   ??? Nsaids (Non-Steroidal Anti-Inflammatory Drug) Shortness of Breath   ??? Sulfa (Sulfonamide Antibiotics) Anaphylaxis   ??? Contrast Agent [Iodine] Rash   ??? Pcn [Penicillins] Hives   ??? Percocet [Oxycodone-Acetaminophen] Other (comments)     headaches         Review of Systems   Review of Systems  HENT:        (+) Left mouth "thickness/heaviness"   Respiratory: Negative for shortness of breath.    Cardiovascular: Negative for chest pain.   Gastrointestinal: Positive for abdominal pain (LUQ heaviness).   Musculoskeletal: Positive for arthralgias (left shoulder heaviness) and neck pain (heaviness).   Neurological: Positive for dizziness. Negative for weakness and numbness.   All other systems reviewed and are negative.      Physical Exam     Vitals:    11/03/16 2000 11/03/16 2015 11/03/16 2016 11/03/16 2023   BP: 104/65 127/88     Pulse: 96  (!) 102    Resp: 23  17    Temp:       SpO2:    98%   Weight:       Height:         Physical Exam   Nursing note and vitals reviewed.  Constitutional: Well appearing, no acute distress  Head: Normocephalic, Atraumatic  Eyes: Pupils are equal, round, and reactive to light, EOMI  ENT: Poor dentition and missing many teeth  Neck: Supple, non-tender  Cardiovascular: Regular rate and tachycardic, no murmurs, rubs, or gallops  Chest: Normal work of breathing and chest excursion bilaterally  Lungs: Clear to ausculation bilaterally   Abdomen: Soft, non tender, non distended, normoactive bowel sounds  Back: No evidence of trauma or deformity  Extremities: No evidence of trauma or deformity, no LE edema  Skin: Warm and dry, normal cap refill  Neuro: Alert and appropriate, CN intact, normal speech, strength and sensation full and symmetric bilaterally, normal coordination  Psychiatric: Normal mood and affect    Diagnostic Study Results     Labs -     Recent Results (from the past 12 hour(s))   CBC WITH AUTOMATED DIFF    Collection Time: 11/03/16  2:01 PM   Result Value Ref Range    WBC 6.6 K/uL    RBC 4.48 M/uL    HGB 12.6 12.0 - 16.0 g/dL    HCT 39.5 %    MCV 88.2 78.0 - 102.0 FL    MCH 28.1 25.0 - 35.0 PG    MCHC 31.9 31.0 - 37.0 g/dL    RDW 15.0 (H) 11.5 - 14.5 %    PLATELET 348 K/uL    MPV 10.6 (H) 7.4 - 10.4 FL    NEUTROPHILS 50 42 - 75 %    LYMPHOCYTES 40 12 - 49 %    MONOCYTES 6 5 - 13 %    EOSINOPHILS 3 0 - 5 %    BASOPHILS 1 0 - 1 %    ABS. NEUTROPHILS 3.3 K/UL    ABS. LYMPHOCYTES 2.6 K/UL    ABS. MONOCYTES 0.4 K/UL    ABS. EOSINOPHILS 0.2 K/UL    ABS. BASOPHILS 0.0 K/UL   D DIMER    Collection Time: 11/03/16  2:01 PM   Result Value Ref Range    D DIMER 3.61 ug/ml(FEU)   CARDIAC PANEL,(CK, CKMB & TROPONIN)    Collection Time: 11/03/16  2:01 PM   Result Value Ref Range    CK 59 21 - 215 U/L    CK - MB 0.5 0.0 - 5.0 ng/ml    CK-MB Index 0.8 0.0 - 4.0 %    Troponin-I, Qt. <0.02 (L) 0.07 - 1.5 NG/ML   METABOLIC PANEL, COMPREHENSIVE    Collection Time: 11/03/16  2:01 PM   Result Value Ref Range    Sodium 142 136 - 145  mmol/L    Potassium 4.4 3.5 - 5.1 mmol/L    Chloride 105 97 - 108 mmol/L    CO2 27 21 - 32 mmol/L    Anion gap 10 5 - 15 mmol/L    Glucose 110 (H) 50 - 100 mg/dL    BUN 11 6 - 20 MG/DL    Creatinine 0.81 MG/DL    BUN/Creatinine ratio 14 12 - 20      Calcium 9.4 8.5 - 10.1 MG/DL    Bilirubin, total 0.2 <1.0 MG/DL    ALT (SGPT) 50 30 - 65 U/L    AST (SGOT) 62 (H) 15 - 37 U/L    Alk. phosphatase 149 (H) 50 - 136 U/L     Protein, total 8.2 6.4 - 8.2 g/dL    Albumin 3.2 (L) 3.5 - 5.0 g/dL    Globulin 5.0 (H) 2.0 - 4.0 g/dL    A-G Ratio 0.6 (L) 1.1 - 2.2     URINALYSIS W/ RFLX MICROSCOPIC    Collection Time: 11/03/16  2:01 PM   Result Value Ref Range    Color YELLOW      Appearance CLEAR      Specific gravity 1.003 1.003 - 1.030      pH (UA) 8.0 5.0 - 8.0      Protein NEGATIVE  NEG mg/dL    Glucose NEGATIVE  NEG mg/dL    Ketone NEGATIVE  NEG mg/dL    Bilirubin NEGATIVE  NEG      Blood NEGATIVE  NEG      Urobilinogen 0.2 0.2 - 1.0 EU/dL    Nitrites NEGATIVE  NEG      Leukocyte Esterase NEGATIVE  NEG     EKG, 12 LEAD, INITIAL    Collection Time: 11/03/16  2:07 PM   Result Value Ref Range    Ventricular Rate 109 BPM    Atrial Rate 109 BPM    P-R Interval 150 ms    QRS Duration 72 ms    Q-T Interval 354 ms    QTC Calculation (Bezet) 476 ms    Calculated P Axis 41 degrees    Calculated R Axis -9 degrees    Calculated T Axis 33 degrees    Diagnosis       Sinus tachycardia  Otherwise normal ECG  When compared with ECG of 21-Oct-2016 15:11,  No significant change was found         Radiologic Studies -   XR CHEST SNGL V   Final Result   Impression:  No radiographic evidence of an acute abnormality.    As read by the radiologist.     CT Results  (Last 48 hours)               11/03/16 2010  CT HEAD WO CONT Final result    Impression:  IMPRESSION:       No acute intracranial abnormalities.       Narrative:  EXAM: CT head       INDICATION: Headache and left-sided head pressure.       COMPARISON: None.       TECHNIQUE: Axial CT imaging of the head was performed without intravenous   contrast. Sagittal and coronal reconstructions were obtained.       One or more dose reduction techniques were used on this CT: automated exposure   control, adjustment of the mAs and/or kVp according to patient's size, and   iterative reconstruction techniques. The specific techniques utilized on  this CT    exam have been documented in the patient's electronic medical record.       _______________       FINDINGS:       BRAIN AND POSTERIOR FOSSA: The sulci, folia, ventricles and basal cisterns are   within normal limits for the patient?s age. There is no intracranial hemorrhage,   mass effect, or midline shift. There are no areas of abnormal parenchymal   attenuation.       EXTRA-AXIAL SPACES AND MENINGES: There are no abnormal extra-axial fluid   collections.       CALVARIUM: Intact.       SINUSES: Clear.       OTHER: None.       _______________               CXR Results  (Last 48 hours)               11/03/16 1415  XR CHEST SNGL V Final result    Impression:  Impression:   No radiographic evidence of an acute abnormality.       Narrative:  Chest, single view       Indication: Hypertension and dizziness       Comparison: None       Findings:  Portable upright AP view of the chest was obtained.  The   cardiomediastinal silhouette is within normal limits.  The pulmonary vasculature   is unremarkable.  Lung parenchyma is well aerated, without focal consolidation.    No pleural effusion nor pneumothorax.  No acute osseous abnormality.                 Medications given in the ED-  Medications   sodium chloride 0.9 % bolus infusion 1,000 mL (0 mL IntraVENous IV Completed 11/03/16 1535)   technetium pentetate (DTPA) aerosol 33 millicurie (33 millicuries Inhalation Given 11/03/16 1833)   technetium albumin aggregated (MAA) solution 6 millicurie (6 millicuries IntraVENous Given 11/03/16 1833)         Medical Decision Making   I am the first provider for this patient.    I reviewed the vital signs, available nursing notes, past medical history, past surgical history, family history and social history.    Vital Signs-Reviewed the patient's vital signs.    Pulse Oximetry Analysis - 100% on RA     Cardiac Monitor:  Rate: 122 bpm  Rhythm: Sinus tachycardia    EKG interpretation: (Preliminary)   Rhythm: Sinus tachycardia. Rate: 109 bpm; QRS duration: 72 ms  EKG read by Philemon Kingdom, MD at 2:08 PM     Records Reviewed: Nursing Notes and Old Medical Records    Procedures:  Procedures    ED Course:   1:57 PM Initial assessment performed. The patients presenting problems have been discussed, and they are in agreement with the care plan formulated and outlined with them.  I have encouraged them to ask questions as they arise throughout their visit.    3:56 PM Pt states that she is allergic to iodine.    7:17 PM  Patient's presentation, labs/imaging and plan of care was reviewed with Leonie Douglas. Scot Dock, MD as part of sign out.  They will wait for V/Q scan results and dispo as part of the plan discussed with the patient.    Leonie Douglas. Scot Dock, MD's assistance in completion of this plan is greatly appreciated but it should be noted that I will be the provider of record for this patient.  Written by Regino Schultze, ED Scribe, as dictated by Philemon Kingdom, MD.       Diagnosis and Disposition     Discussion:  66 y.o female with HTN and vague left sided discomfort, status post recent hysterectomy. Labs benign except for elevated D-dimer pending VQ scan. If VQ negative anticipate discharge home with PCP follow up and return precautions.     DISCHARGE NOTE:  8:39 PM  [name]'s  results have been reviewed with her.  She has been counseled regarding her diagnosis, treatment, and plan.  She verbally conveys understanding and agreement of the signs, symptoms, diagnosis, treatment and prognosis and additionally agrees to follow up as discussed.  She also agrees with the care-plan and conveys that all of her questions have been answered.  I have also provided discharge instructions for her that include: educational information regarding their diagnosis and treatment, and list of reasons why they would want to return to the ED prior to their follow-up appointment, should her condition change. She has  been provided with education for proper emergency department utilization.     CLINICAL IMPRESSION:    1. Hypertension, unspecified type    2. Tachycardia    3. Atypical chest pain    4. Endometrial cancer ([place])        PLAN:  1. D/C Home  2.   Current Discharge Medication List      CONTINUE these medications which have NOT CHANGED    Details   HYDROcodone-acetaminophen (NORCO) 5-325 mg per tablet Take 1-2 Tabs by mouth every four (4) hours as needed. Max Daily Amount: 12 Tabs. Indications: Pain  Qty: 60 Tab, Refills: 0    Associated Diagnoses: Postoperative pain      cholecalciferol (VITAMIN D3) 1,000 unit tablet 1,000 Units.      ferrous sulfate 325 mg (65 mg iron) tablet 325 mg.      multivitamin (ONE A DAY) tablet Take 1 Tab by mouth daily.      CYANOCOBALAMIN, VITAMIN B-12, (VITAMIN B12 PO) Take 1 Tab by mouth.           3.   Follow-up Information     Follow up With Details Comments Contact Info    Christa See, MD Schedule an appointment as soon as possible for a visit For primary care follow up [place] [address]  [phone]      [facility] EMERGENCY DEPT  As needed, if symptoms worsen [address]  [place] [address]  [phone]        _______________________________    Attestations:  This note is prepared by Trinna Balloon and Regino Schultze, acting as Scribes for Philemon Kingdom, MD.    Philemon Kingdom, MD:  The scribe's documentation has been prepared under my direction and personally reviewed by me in its entirety.  I confirm that the note above accurately reflects all work, treatment, procedures, and medical decision making performed by me.  _______________________________

## 2016-11-03 NOTE — Telephone Encounter (Signed)
Returned call to patient who states "I'm not in no pain or nothin, but I feel dizzy and my face doesn't feel right". Reprots dizziness, elevated diastolic BP, systolic "ok". Has called PCP office with no return call yet.   Patient states she is going to [facility] ED for further evaluation.   Will f/u.

## 2016-11-03 NOTE — ED Notes (Signed)
Pt back in room.  CT scan complete.  O2 Sat 98 % RA.  Pt currently on cell phone with family at bedside. NAD observed.

## 2016-11-09 LAB — EKG, 12 LEAD, INITIAL
Atrial Rate: 109 {beats}/min
Calculated P Axis: 41 degrees
Calculated R Axis: -9 degrees
Calculated T Axis: 33 degrees
P-R Interval: 150 ms
Q-T Interval: 354 ms
QRS Duration: 72 ms
QTC Calculation (Bezet): 476 ms
Ventricular Rate: 109 {beats}/min

## 2016-11-10 ENCOUNTER — Telehealth

## 2016-11-10 NOTE — Telephone Encounter (Signed)
LMTC

## 2016-11-10 NOTE — Telephone Encounter (Signed)
Patient called the office with multiple questions.  She would like to know the pathology results from he surgery and would also like a refill of her pain medication.  She also states that she is still experiencing constipation and has started to bruise when she gives herself her vitamin B12 shots.  Please call her back at [phone].

## 2016-11-12 MED ORDER — HYDROCODONE-ACETAMINOPHEN 5 MG-325 MG TAB
5-325 mg | ORAL_TABLET | ORAL | 0 refills | Status: DC | PRN
Start: 2016-11-12 — End: 2016-12-04

## 2016-11-12 NOTE — Telephone Encounter (Signed)
Patient returned call and states she is feeling somewhat better but still had questions. C/o vaginal pain intermittent but very uncomfortable at times. States she is out of Norco, but has been using extra strength tylenol intermittently. Denies vaginal bleeding or discharge. Admits to having increased ambulation prior to symptoms. Reassurance provided, will refill Norco as requested. Patient also admits to occasional constipation, taking stool softener TID and drinking "plenty of water, trying to eat fruit and oatmeal". Explained that she may use Miralax PRN and to call if sx's persist. Reports that bruising with B12 injections, has improved. Reviewed pathology per pt request. Stage 1A Grade 2, no adjuvant treatment needed, explained surveillance interval and will further discuss at postop appointment on 5/30. All questions answered, patient advised to return call to office with any additional questions or concerns.    >15 min phone call

## 2016-11-25 NOTE — Telephone Encounter (Signed)
Attempted to reach patient to reschedule appointment on 11/25/16 to earlier time, no answer.  Will try again in AM.

## 2016-11-26 ENCOUNTER — Encounter: Attending: Women's Health | Primary: Family Medicine

## 2016-11-26 NOTE — Telephone Encounter (Signed)
Left voicemail for patient to call to reschedule her appointment to an earlier time.

## 2016-12-01 NOTE — Telephone Encounter (Signed)
Tried reaching patient to reschedule appointment on 12/03/16 but voicemail was full.

## 2016-12-03 ENCOUNTER — Encounter: Attending: Women's Health | Primary: Family Medicine

## 2016-12-04 ENCOUNTER — Ambulatory Visit
Admit: 2016-12-04 | Discharge: 2016-12-04 | Payer: PRIVATE HEALTH INSURANCE | Attending: Women's Health | Primary: Family Medicine

## 2016-12-04 ENCOUNTER — Encounter: Attending: Women's Health | Primary: Family Medicine

## 2016-12-04 DIAGNOSIS — C541 Malignant neoplasm of endometrium: Secondary | ICD-10-CM

## 2016-12-04 MED ORDER — HYDROCODONE-ACETAMINOPHEN 5 MG-325 MG TAB
5-325 mg | ORAL_TABLET | ORAL | 0 refills | Status: DC | PRN
Start: 2016-12-04 — End: 2017-09-24

## 2016-12-04 NOTE — Progress Notes (Signed)
[facility]  [address] [place], [place]  [place], [place], [place]  [phone] FAX [phone]  Leeanne Rio [facility]        Postoperative Office Note  Patient ID:  Name: [name]  MRM: [id number]  DOB: 05/10/1951/66 y.o.  Date: 12/04/2016    SUBJECTIVE:    This is a 66 y.o. African American female who presents s/p Robotic assisted total laparoscopic hysterectomy, bilateral salpingo-oophorectomy, bilateral pelvic lymph node dissection, washing on 10/27/16. Currently she has intermittent pelvic/vaginal cramping pain. Taking Tylenol with little relief and using Norco sparingly PRN. Appetite is good. Eating a regular diet without difficulty. Urinating without difficulty. Bowel movements are alternating constipation and regularity, taking colace and using dietary sources of fiber.       Her pathology revealed    A: UTERUS, CERVIX, BILATERAL FALLOPIAN TUBES AND OVARIES:   PROCEDURE: Total hysterectomy and bilateral salpingo-oophorectomy.   HISTOLOGIC TYPE: Endometrioid carcinoma, NOS.   HISTOLOGIC GRADE: FIGO Grade 2.   MYOMETRIAL INVASION: Present, depth of invasion 5/23 mm (22%).   UTERINE SEROSA INVOLVEMENT: Not identified.   CERVICAL STROMAL INVOLVEMENT: Not identified.   OTHER TISSUE/ORGAN INVOLVEMENT: Not identified.   MARGINS: Not involved: Greater than 1.5 cm distance.   PELVIC WASHINGS ([id number]): No malignant cells found.   LYMPHOVASCULAR INVASION: Not identified.   PATHOLOGIC STAGE CLASSIFICATION: pT1a, N0 (FIGO IA).   ANCILLARY STUDIES:   MMR IHC: See [facility] report [id number].   ER/PR Results:   Estrogen Receptor (ER) Status: Positive (95% of cells staining/strong intensity).   Progesterone Receptor (PgR) Status: Positive (60% of cells staining/moderate intensity).   B: LEFT PELVIC LYMPH NODES, DISSECTION: Not involved (7 NODES).    C: RIGHT PELVIC LYMPH NODES, DISSECTION: Not involved (8 NODES).     Washings, Pelvic   Satisfactory for evaluation.   No malignant cells found.     Medications:     Current Outpatient Prescriptions on File Prior to Visit   Medication Sig Dispense Refill   ??? cholecalciferol (VITAMIN D3) 1,000 unit tablet 1,000 Units.     ??? ferrous sulfate 325 mg (65 mg iron) tablet 325 mg.     ??? multivitamin (ONE A DAY) tablet Take 1 Tab by mouth daily.     ??? CYANOCOBALAMIN, VITAMIN B-12, (VITAMIN B12 PO) Take 1 Tab by mouth.       No current facility-administered medications on file prior to visit.        Allergies:     Allergies   Allergen Reactions   ??? Aspirin Shortness of Breath   ??? Nsaids (Non-Steroidal Anti-Inflammatory Drug) Shortness of Breath   ??? Sulfa (Sulfonamide Antibiotics) Anaphylaxis   ??? Contrast Agent [Iodine] Rash   ??? Pcn [Penicillins] Hives   ??? Percocet [Oxycodone-Acetaminophen] Other (comments)     headaches       OBJECTIVE:    Vitals:   Visit Vitals   ??? BP 127/82 (BP 1 Location: Left arm, BP Patient Position: Sitting)   ??? Pulse (!) 106   ??? Temp 97.8 ??F (36.6 ??C) (Oral)   ??? Resp 18   ??? Ht '5\' 4"'$  (1.626 m)   ??? Wt 111.4 kg (245 lb 9.6 oz)   ??? LMP  (LMP Unknown)   ??? SpO2 97%   ??? BMI 42.16 kg/m2       Physical Examination:    General:  alert, cooperative, no distress   Abdomen: soft, bowel  sounds active, non-tender   Incision: healing well   Pelvic: NEFG, Spec exam revealed vaginal cuff intact, BME revealed vaginal cuff intact, nontender   Rectal: not done   Extremity:   extremities normal, atraumatic, no cyanosis or edema     IMPRESSION/PLAN:    Lindzee Gouge is doing well postoperatively.  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  The operative procedures and clinical results have been reviewed with the patient.  Implications of diagnosis discussed at length.  All questions answered.   Reviewed signs and symptoms of recurrence and discussed surveillance interval.   Patient may resume pre-op level of activity as tolerated. Reinforced additional 4 weeks of pelvic rest.  Postop pain. Limited Norco refill provided today for use PRN.   I will see the patient back in 4 months for routine surveillance. The patient is advised to call our office with any problems or concerns.    Leeanne Rio [facility]  Gynecologic Oncology  12/04/2016/5:22 PM

## 2016-12-04 NOTE — Patient Instructions (Signed)
Laparoscopic Hysterectomy: What to Expect at [facility]    A hysterectomy is surgery to take out the uterus. In somecases, the ovaries and fallopian tubes also are taken out at thesame time.  You can expect to feel better and stronger each day, but you may need pain medicine for a week or two. You may get tired easily or have less energy than usual. The tiredness may last for several weeks after surgery. You will probably notice that your belly is swollen and puffy. This is common. The swelling will take several weeks to go down. You may take about 4 to 6 weeks to fully recover.  It is important to avoid lifting while you are recovering so that you can heal.  This care sheet gives you a general idea about how long it will take for you to recover. But each person recovers at a different pace. Follow the steps below to get better as quickly as possible.  How can you care for yourself at home?  Activity  ? ?? Rest when you feel tired.   ? ?? Be active. Walking is a good choice.   ? ?? Allow the area to heal. Don't move quickly or lift anything heavy until you are feeling better.   ? ?? You may shower 24 to 48 hours after surgery, if your doctor okays it. Pat the incision dry. Do not take a bath for the first 2 weeks, or until your doctor tells you it is okay.   ? ?? Ask your doctor when it is okay for you to have sex.   Diet  ? ?? You can eat your normal diet. If your stomach is upset, try bland, low-fat foods like plain rice, broiled chicken, toast, and yogurt.   ? ?? If your bowel movements are not regular right after surgery, try to avoid constipation and straining. Drink plenty of water. Your doctor may suggest fiber, a stool softener, or a mild laxative.   Medicines  ? ?? Your doctor will tell you if and when you can restart your medicines. He or she will also give you instructions about taking any new medicines.   ? ?? If you take blood thinners, such as warfarin (Coumadin), clopidogrel  (Plavix), or aspirin, be sure to talk to your doctor. He or she will tell you if and when to start taking those medicines again. Make sure that you understand exactly what your doctor wants you to do.   ? ?? Be safe with medicines. Read and follow all instructions on the label.  ?? If the doctor gave you a prescription medicine for pain, take it as prescribed.  ?? If you are not taking a prescription pain medicine, ask your doctor if you can take an over-the-counter medicine.   ? ?? If your doctor prescribed antibiotics, take them as directed. Do not stop taking them just because you feel better. You need to take the full course of antibiotics.   Incision care  ? ?? You may have stitches over the cuts (incisions) the doctor made in your belly.   ? ?? If you have strips of tape on the cut (incision) the doctor made, leave the tape on for a week or until it falls off.   ? ?? Wash the area daily with warm, soapy water, and pat it dry. Don't use hydrogen peroxide or alcohol. They can slow healing.   ? ?? You may cover the area with a gauze bandage if it  oozes fluid or rubs against clothing.   ? ?? Change the bandage every day.   Other instructions  ? ?? You may have some light vaginal bleeding. Wear sanitary pads if needed. Do not douche or use tampons.   ? ?? Don't have sex until the doctor says it is okay.   Follow-up care is a key part of your treatment and safety. Be sure to make and go to all appointments, and call your doctor if you are having problems. It's also a good idea to know your test results and keep a list of the medicines you take.  When should you call for help?  Call 911 anytime you think you may need emergency care. For example, call if:  ? ?? You passed out (lost consciousness).   ? ?? You have chest pain, are short of breath, or cough up blood.   ?Call your doctor now or seek immediate medical care if:  ? ?? You have pain that does not get better after you take pain medicine.    ? ?? You cannot pass stoolsl or gas.   ? ?? You have vaginal discharge that has increased in amount or smells bad.   ? ?? You are sick to your stomach or cannot drink fluids.   ? ?? You have loose stitches, or your incision comes open.   ? ?? Bright red blood has soaked through the bandage over your incision.   ? ?? You have signs of infection, such as:  ?? Increased pain, swelling, warmth, or redness.  ?? Red streaks leading from the incision.  ?? Pus draining from the incision.  ?? A fever.   ? ?? You have bright red vaginal bleeding that soaks one or more pads in an hour, or you have large clots.   ? ?? You have signs of a blood clot in your leg (called a deep vein thrombosis), such as:  ?? Pain in your calf, back of the knee, thigh, or groin.  ?? Redness and swelling in your leg or groin.   ?Watch closely for changes in your health, and be sure to contact your doctor if you have any problems.  Where can you learn more?  Go to [email or web address].  Enter Q131 in the search box to learn more about "Laparoscopic Hysterectomy: What to Expect at Home."  Current as of: April 12, 2015  Content Version: 11.4  ?? 2006-2017 [employer], Incorporated. Care instructions adapted under license by [employer] (which disclaims liability or warranty for this information). If you have questions about a medical condition or this instruction, always ask your healthcare professional. [place] any warranty or liability for your use of this information.

## 2016-12-04 NOTE — Progress Notes (Signed)
[name], a 66 y.o. female,  is here for   Chief Complaint   Patient presents with   ??? Surgical Follow-up     4 week post-op TLH/BSO        Visit Vitals   ??? BP 127/82 (BP 1 Location: Left arm, BP Patient Position: Sitting)   ??? Pulse (!) 106   ??? Temp 97.8 ??F (36.6 ??C) (Oral)   ??? Resp 18   ??? Ht 5\' 4"  (1.626 m)   ??? Wt 111.4 kg (245 lb 9.6 oz)   ??? SpO2 97%   ??? BMI 42.16 kg/m2       Patient reports intermittent vaginal pain, described as "early labor pains," happens randomly but notices that the pain is more active as night. Tries taking tylenol but that "only takes the edge off" and usually takes a dose of Norco. Also reports pain during a bowel movement, believes it is partially contributed to constipation and partly related to the "soreness" in her vagina. Has been using a stool softener (generic colace) and eating certain foods (21 grain bread, fiber, etc) to help alleviate the constipation.   Denies any unusual vaginal bleeding, discharge, irritation, or odor. No burning, discomfort, or irritation with urination  Denies any nausea, vomiting, fevers, or chills.    1. Have you been to the ER, urgent care clinic since your last visit?  Hospitalized since your last visit?Yes When: 11/03/2016 Where: [facility] Reason for visit: Pain    2. Have you seen or consulted any other health care providers outside of the [facility] since your last visit?  Include any pap smears or colon screening. No

## 2017-04-06 ENCOUNTER — Encounter: Attending: Women's Health | Primary: Family Medicine

## 2017-04-14 NOTE — Telephone Encounter (Signed)
Contacted patient to see if patient could move her appointment time up to an earlier time due to NP-Manzlak being out of the office later that afternoon.Patient stated she would contact the office once her daughter got back from her interview with a confirmed time or changed date if need be. I stated to patient that the office does close at 5:00 PM. Patient said "ok"

## 2017-04-15 ENCOUNTER — Encounter: Attending: Women's Health | Primary: Family Medicine

## 2017-04-20 ENCOUNTER — Ambulatory Visit
Admit: 2017-04-20 | Discharge: 2017-04-20 | Payer: PRIVATE HEALTH INSURANCE | Attending: Women's Health | Primary: Family Medicine

## 2017-04-20 DIAGNOSIS — C541 Malignant neoplasm of endometrium: Secondary | ICD-10-CM

## 2017-04-20 NOTE — Progress Notes (Signed)
[name], a 66 y.o. female,  is here for   Chief Complaint   Patient presents with   ??? Uterine Cancer     4 month surveillance       Visit Vitals  BP 125/76 (BP 1 Location: Left arm, BP Patient Position: Sitting)   Pulse 97   Temp 97.8 ??F (36.6 ??C) (Oral)   Resp 20   Ht 5\' 4"  (1.626 m)   Wt 112 kg (247 lb)   SpO2 98%   BMI 42.40 kg/m??       Patient reports intermittent pelvic pain, described as feeling like "uterine cramps", does not recognize any aggravating factors, happens randomly. First began following her surgery with Dr. Verner Mould. Denies any unusual vaginal bleeding, discharge, irritation, or odor. Denies experiencing any constipation or diarrhea. No burning, discomfort, or irritation with urination.      1. Have you been to the ER, urgent care clinic since your last visit?  Hospitalized since your last visit?No    2. Have you seen or consulted any other health care providers outside of the [facility] since your last visit?  Include any pap smears or colon screening. Yes Where: Dr. Eulas Post Reason for visit: follow up for allergies

## 2017-04-20 NOTE — Progress Notes (Signed)
[facility]  [address], [place], [place]  [place], [place]  [place], [place]  [phone] FAX [phone]  Leeanne Rio [facility]        Office Note  Patient ID:  Name: [name]  MRM: [id number]  DOB: Sep 06, 1950/66 y.o.  Date: 04/20/2017    SUBJECTIVE:    This is a 66 y.o. 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 She is s/p Robotic assisted total laparoscopic hysterectomy, bilateral salpingo-oophorectomy, bilateral pelvic lymph node dissection, washing on 10/27/16. Patient reports intermittent cramping pelvic pain. Denies aggravating or alleviating factors. She denies vaginal discharge, bleeding. No changes in bowel or bladder habits.     Her pathology revealed    A: UTERUS, CERVIX, BILATERAL FALLOPIAN TUBES AND OVARIES:   PROCEDURE: Total hysterectomy and bilateral salpingo-oophorectomy.   HISTOLOGIC TYPE: Endometrioid carcinoma, NOS.   HISTOLOGIC GRADE: FIGO Grade 2.   MYOMETRIAL INVASION: Present, depth of invasion 5/23 mm (22%).   UTERINE SEROSA INVOLVEMENT: Not identified.   CERVICAL STROMAL INVOLVEMENT: Not identified.   OTHER TISSUE/ORGAN INVOLVEMENT: Not identified.   MARGINS: Not involved: Greater than 1.5 cm distance.   PELVIC WASHINGS ([id number]): No malignant cells found.   LYMPHOVASCULAR INVASION: Not identified.   PATHOLOGIC STAGE CLASSIFICATION: pT1a, N0 (FIGO IA).   ANCILLARY STUDIES:   MMR IHC: See [facility] report [id number].   ER/PR Results:   Estrogen Receptor (ER) Status: Positive (95% of cells staining/strong intensity).   Progesterone Receptor (PgR) Status: Positive (60% of cells staining/moderate intensity).   B: LEFT PELVIC LYMPH NODES, DISSECTION: Not involved (7 NODES).   C: RIGHT PELVIC LYMPH NODES, DISSECTION: Not involved (8 NODES).     Washings, Pelvic   Satisfactory for evaluation.   No malignant cells found.     Medications:      Current Outpatient Medications on File Prior to Visit   Medication Sig Dispense Refill   ??? acetaminophen (TYLENOL PO) Take  by mouth as needed for Pain.     ??? HYDROcodone-acetaminophen (NORCO) 5-325 mg per tablet Take 1-2 Tabs by mouth every four (4) hours as needed. Max Daily Amount: 12 Tabs. Indications: Pain 15 Tab 0   ??? cholecalciferol (VITAMIN D3) 1,000 unit tablet 1,000 Units.     ??? ferrous sulfate 325 mg (65 mg iron) tablet 325 mg.     ??? multivitamin (ONE A DAY) tablet Take 1 Tab by mouth daily.     ??? CYANOCOBALAMIN, VITAMIN B-12, (VITAMIN B12 PO) Take 1 Tab by mouth.       No current facility-administered medications on file prior to visit.        Allergies:     Allergies   Allergen Reactions   ??? Aspirin Shortness of Breath   ??? Nsaids (Non-Steroidal Anti-Inflammatory Drug) Shortness of Breath   ??? Sulfa (Sulfonamide Antibiotics) Anaphylaxis   ??? Contrast Agent [Iodine] Rash   ??? Pcn [Penicillins] Hives   ??? 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  The operative procedures and clinical results previously reviewed with the patient. No adjuvant treatment needed.  Implications of diagnosis discussed at length.  All questions answered.   Reviewed signs and symptoms of recurrence and discussed surveillance interval.  I will see the patient back in 4 months for routine surveillance. The patient is advised to call our office with any problems or concerns.    Leeanne Rio [facility]  Gynecologic Oncology  04/20/2017/1:12 PM

## 2017-04-20 NOTE — Patient Instructions (Signed)
Below you will find information on the condition you were previously diagnosed with. Please call the office as soon as possible if you begin to experience the following symptoms: Persistent or worsening abdominal or pelvic pain, any unusual vaginal bleeding, discharge, odor, or irritation, and any changes in bladder or bowel habits such as constipation, diarrhea, burning, or general discomfort. Please call the office if you have any other questions or concerns.    Uterine Cancer: Care Instructions  Your Care Instructions  Uterine cancer is the rapid growth of abnormal cells that line the uterus. It also is called endometrial cancer. These cells may spread to nearby organs, lymph glands, or distant organs. Uterine cancer can be cured most often when found early.  Treatment may include surgery to remove the uterus, ovaries, fallopian tubes, and sometimes the pelvic lymph nodes. Radiation and hormones to stop cancer growth also are sometimes used. Chemotherapy may be used if the cancer has spread.  Having cancer can be scary. You may feel many emotions and may need some help coping. Seek out family, friends, and counselors for support. You can do things at home to make yourself feel better while you go through treatment. You also can call the [facility] [phone]) or visit its website at [facility].org for more information.  Follow-up care is a key part of your treatment and safety. Be sure to make and go to all appointments, and call your doctor if you are having problems. It's also a good idea to know your test results and keep a list of the medicines you take.  How can you care for yourself at home?  ?? Take your medicines exactly as prescribed. Call your doctor if you think you are having a problem with your medicine. You may get medicine for nausea and vomiting if you have these side effects.  ?? Eat healthy food. If you do not feel like eating, try to eat food that  has protein and extra calories to keep up your strength and prevent weight loss. Drink liquid meal replacements for extra calories and protein. Try to eat your main meal early.  ?? Get some physical activity every day, but do not get too tired. Keep doing the hobbies you enjoy as your energy allows.  ?? Take steps to control your stress and workload. Learn relaxation techniques.  ? Share your feelings. Stress and tension affect our emotions. By expressing your feelings to others, you may be able to understand and cope with them.  ? Consider joining a support group. Talking about a problem with your spouse, a good friend, or other people with similar problems is a good way to reduce tension and stress.  ? Express yourself through art. Try writing, dance, art, or crafts to relieve tension. Some dance, writing, or art groups may be available just for people who have cancer.  ? Be kind to your body and mind. Getting enough sleep, eating a healthy diet, and taking time to do things you enjoy can contribute to an overall feeling of balance in your life and help reduce stress.  ? Get help if you need it. Discuss your concerns with your doctor or counselor.  ?? If you are vomiting or have diarrhea:  ? Drink plenty of fluids (enough so that your urine is light yellow or clear like water) to prevent dehydration. Choose water and other caffeine-free clear liquids. If you have kidney, heart, or liver disease and have to limit fluids, talk with your doctor before  you increase the amount of fluids you drink.  ? When you are able to eat, try clear soups, mild foods, and liquids until all symptoms are gone for 12 to 48 hours. Other good choices include dry toast, crackers, cooked cereal, and gelatin dessert, such as Jell-O.  ?? Take care of your urinary tract to prevent problems such as infection, which can be caused by uterine cancer and its treatment. Limit drinks with  caffeine, drink plenty of fluids, and urinate every 3 to 4 hours.  ?? If you have not already done so, prepare a list of advance directives. Advance directives are instructions to your doctor and family members about what kind of care you want if you become unable to speak or express yourself.  When should you call for help?  Call 911 anytime you think you may need emergency care. For example, call if:  ?? ?? You passed out (lost consciousness).   ??Call your doctor now or seek immediate medical care if:  ?? ?? You have a fever.   ?? ?? You have abnormal bleeding.   ?? ?? You have new or worse pain.   ?? ?? You think you have an infection.   ?? ?? You have new symptoms, such as a cough, belly pain, vomiting, diarrhea, or a rash.   ??Watch closely for changes in your health, and be sure to contact your doctor if:  ?? ?? You are much more tired than usual.   ?? ?? You have swollen glands in your armpits, groin, or neck.   ?? ?? You do not get better as expected.   Where can you learn more?  Go to [email or web address].  Enter E343 in the search box to learn more about "Uterine Cancer: Care Instructions."  Current as of: September 24, 2016  Content Version: 11.8  ?? 2006-2018 Healthwise, Incorporated. Care instructions adapted under license by [employer] (which disclaims liability or warranty for this information). If you have questions about a medical condition or this instruction, always ask your healthcare professional. [place] any warranty or liability for your use of this information.

## 2017-07-30 NOTE — Telephone Encounter (Signed)
Spoke with patient regarding rescheduling her appointment on 08/03/17.  She stated that she needed to speak with her daughter regarding transportation before she could reschedule.  She will do so tonight and call the office in the morning to reschedule her appointment.

## 2017-07-31 NOTE — Telephone Encounter (Signed)
Spoke with patient again regarding rescheduling her appointment.  She stated that her daughter has not yet given her a day that she is available to drive the patient to her appointment.  A list of days NP Manzlak is in office was provided to the patient to assist her in rescheduling.  She will call back once her daughter knows her work schedule.

## 2017-08-03 ENCOUNTER — Encounter: Attending: Women's Health | Primary: Family Medicine

## 2017-09-21 ENCOUNTER — Encounter: Attending: Women's Health | Primary: Family Medicine

## 2017-09-24 ENCOUNTER — Ambulatory Visit
Admit: 2017-09-24 | Discharge: 2017-09-24 | Payer: PRIVATE HEALTH INSURANCE | Attending: Women's Health | Primary: Family Medicine

## 2017-09-24 DIAGNOSIS — C541 Malignant neoplasm of endometrium: Secondary | ICD-10-CM

## 2017-09-24 NOTE — Progress Notes (Signed)
[name], a 67 y.o. female,  is here for   Chief Complaint   Patient presents with   ??? Uterine Cancer     3 month surveillance       Visit Vitals  BP 126/72 (BP 1 Location: Left arm, BP Patient Position: Sitting)   Pulse 93   Temp 96 ??F (35.6 ??C) (Oral)   Resp 20   Ht 5\' 4"  (1.626 m)   Wt 113.2 kg (249 lb 9.6 oz)   SpO2 97%   BMI 42.84 kg/m??       Patient reports current flatulence, reports eating glazed carrots, mashed potatoes with gravy, and pork chops for breakfast this morning.     Patient denies any persistent or worsening abdominal or pelvic pain. Denies any unusual vaginal bleeding, discharge, irritation, or odor. Denies experiencing any constipation or diarrhea. No burning, discomfort, or irritation with urination.    1. Have you been to the ER, urgent care clinic since your last visit?  Hospitalized since your last visit?No    2. Have you seen or consulted any other health care providers outside of the [facility] since your last visit?  Include any pap smears or colon screening. No

## 2017-09-24 NOTE — Progress Notes (Signed)
[facility]  [address] [place], [place]  [place], [place]  [place], [place]  [phone] FAX [phone]  Leeanne Rio [facility]        Office Note  Patient ID:  Name: [name]  MRM: [id number]  DOB: 05-Apr-1951/67 y.o.  Date: 09/21/2017    SUBJECTIVE:    This is a 67 y.o. 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 She is s/p Robotic assisted total laparoscopic hysterectomy, bilateral salpingo-oophorectomy, bilateral pelvic lymph node dissection, washing on 10/27/16. Patient reports intermittent mild cramping pelvic pain. Denies aggravating or alleviating factors. She denies vaginal discharge, bleeding. No changes in bowel habits. Admits to mixed stress and urge urinary incontinence symptoms.     Her pathology revealed    A: UTERUS, CERVIX, BILATERAL FALLOPIAN TUBES AND OVARIES:   PROCEDURE: Total hysterectomy and bilateral salpingo-oophorectomy.   HISTOLOGIC TYPE: Endometrioid carcinoma, NOS.   HISTOLOGIC GRADE: FIGO Grade 2.   MYOMETRIAL INVASION: Present, depth of invasion 5/23 mm (22%).   UTERINE SEROSA INVOLVEMENT: Not identified.   CERVICAL STROMAL INVOLVEMENT: Not identified.   OTHER TISSUE/ORGAN INVOLVEMENT: Not identified.   MARGINS: Not involved: Greater than 1.5 cm distance.   PELVIC WASHINGS ([id number]): No malignant cells found.   LYMPHOVASCULAR INVASION: Not identified.   PATHOLOGIC STAGE CLASSIFICATION: pT1a, N0 (FIGO IA).   ANCILLARY STUDIES:   MMR IHC: See [facility] report [id number].   ER/PR Results:   Estrogen Receptor (ER) Status: Positive (95% of cells staining/strong intensity).   Progesterone Receptor (PgR) Status: Positive (60% of cells staining/moderate intensity).   B: LEFT PELVIC LYMPH NODES, DISSECTION: Not involved (7 NODES).   C: RIGHT PELVIC LYMPH NODES, DISSECTION: Not involved (8 NODES).     Washings, Pelvic    Satisfactory for evaluation.   No malignant cells found.     Medications:     Current Outpatient Medications on File Prior to Visit   Medication Sig Dispense Refill   ??? acetaminophen (TYLENOL PO) Take  by mouth as needed for Pain.     ??? HYDROcodone-acetaminophen (NORCO) 5-325 mg per tablet Take 1-2 Tabs by mouth every four (4) hours as needed. Max Daily Amount: 12 Tabs. Indications: Pain 15 Tab 0   ??? cholecalciferol (VITAMIN D3) 1,000 unit tablet 1,000 Units.     ??? ferrous sulfate 325 mg (65 mg iron) tablet 325 mg.     ??? multivitamin (ONE A DAY) tablet Take 1 Tab by mouth daily.     ??? CYANOCOBALAMIN, VITAMIN B-12, (VITAMIN B12 PO) Take 1 Tab by mouth every seven (7) days.       No current facility-administered medications on file prior to visit.        Allergies:     Allergies   Allergen Reactions   ??? Aspirin Shortness of Breath   ??? Nsaids (Non-Steroidal Anti-Inflammatory Drug) Shortness of Breath   ??? Sulfa (Sulfonamide Antibiotics) Anaphylaxis   ??? Contrast Agent [Iodine] Rash   ??? Pcn [Penicillins] Hives   ??? 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  The operative procedures and clinical results previously reviewed with the patient. No adjuvant treatment needed.  Mixed urinary incontinence. Will refer to urogyn for further evaluation and discussion of management options.   Reviewed signs and symptoms of recurrence and discussed surveillance interval.  I will see the patient back in 4 months for routine surveillance. The  patient is advised to call our office with any problems or concerns.    Leeanne Rio [facility]  Gynecologic Oncology  09/21/2017/11:12 AM

## 2017-09-24 NOTE — Patient Instructions (Signed)
Pelvic Exam: Care Instructions  Your Care Instructions    When your doctor examines all of your pelvic organs, it's called a pelvic exam. Two good reasons to have this kind of exam are to check for sexually transmitted infections (STIs) and to get a Pap test. A Pap test is also called a Pap smear. It checks for early changes that can lead to cancer of the cervix.  Sometimes a pelvic exam is part of a regular checkup. In this case, you can do some things to make your test results as accurate as possible.  ?? Try to schedule the exam when you don't have your period.  ?? Don't use douches, tampons, or vaginal medicines, sprays, or powders for 24 hours before your exam.  ?? Don't have sex for 24 hours before your exam.  Other times, women have this kind of exam at any time of the month. This is because they have pelvic pain, bleeding, or discharge. Or they may have another pelvic problem.  Before your exam, it's important to share some information with your doctor. For example, if you are a survivor of rape or sexual abuse, you can talk about any concerns you may have. Your doctor will also want to know if you are pregnant or use birth control. And he or she will want to hear about any problems, surgeries, or procedures you have had in your pelvic area. You will also need to tell your doctor when your last period was.  Follow-up care is a key part of your treatment and safety. Be sure to make and go to all appointments, and call your doctor if you are having problems. It's also a good idea to know your test results and keep a list of the medicines you take.  How is a pelvic exam done?  ?? During a pelvic exam, you will:  ? Take off your clothes below the waist. You will get a paper or cloth cover to put over the lower half of your body.  ? Lie on your back on an exam table. Your feet will be raised above you. Stirrups will support your feet.  ?? The doctor will:   ? Ask you to relax your knees. Your knees need to lean out, toward the walls.  ? Check the opening of your vagina for sores or swelling.  ? Gently put a tool called a speculum into your vagina. It opens the vagina a little bit. You will feel some pressure. But if you are relaxed, it will not hurt. It lets your doctor see inside the vagina.  ? Use a small brush, spatula, or swab to get a sample of cells, if you are having a Pap test or culture. The doctor then removes the speculum.  ? Put on gloves and put one or two fingers of one hand into your vagina. The other hand goes on your lower belly. This lets your doctor feel your pelvic organs. You will probably feel some pressure. Try to stay relaxed.  ? Put one gloved finger into your rectum and one into your vagina, if needed. This can also help check your pelvic organs.  This exam takes about 10 minutes. At the end, you will get a washcloth or tissue to clean your vaginal area. It's normal to have some discharge after this exam. You can then get dressed.  Some test results may be ready right away. But results from a culture or a Pap test may take several days or a   few weeks.  Why should you have a pelvic exam?  ?? You want to have recommended screening tests. This includes a Pap test.  ?? You think you have a vaginal infection. Signs include itching, burning, or unusual discharge.  ?? You might have been exposed to a sexually transmitted infection (STI), such as chlamydia or herpes.  ?? You have vaginal bleeding that is not part of your normal menstrual period.  ?? You have pain in your belly or pelvis.  ?? You have been sexually assaulted. A pelvic exam lets your doctor collect evidence and check for STIs.  ?? You are pregnant.  ?? You are having trouble getting pregnant.  What are the risks of a pelvic exam?  There are no risks from a pelvic exam.  When should you call for help?  Watch closely for changes in your health, and be sure to contact your  doctor if you have any problems.  Where can you learn more?  Go to [email or web address].  Enter [phone] in the search box to learn more about "Pelvic Exam: Care Instructions."  Current as of: Nov 10, 2016  Content Version: 11.9  ?? 2006-2018 [employer]. Care instructions adapted under license by [employer] (which disclaims liability or warranty for this information). If you have questions about a medical condition or this instruction, always ask your healthcare professional. [place] any warranty or liability for your use of this information.         Below you will find information on the condition you were previously diagnosed with. Please call the office as soon as possible if you begin to experience the following symptoms: Persistent or worsening abdominal or pelvic pain, any unusual vaginal bleeding, discharge, odor, or irritation, and any changes in bladder or bowel habits such as constipation, diarrhea, burning, or general discomfort. Please call the office if you have any other questions or concerns.    Uterine Cancer: Care Instructions  Your Care Instructions  Uterine cancer is the rapid growth of abnormal cells that line the uterus. It also is called endometrial cancer. These cells may spread to nearby organs, lymph glands, or distant organs. Uterine cancer can be cured most often when found early.  Treatment may include surgery to remove the uterus, ovaries, fallopian tubes, and sometimes the pelvic lymph nodes. Radiation and hormones to stop cancer growth also are sometimes used. Chemotherapy may be used if the cancer has spread.  Having cancer can be scary. You may feel many emotions and may need some help coping. Seek out family, friends, and counselors for support. You can do things at home to make yourself feel better while you go through treatment. You also can call the [facility] [phone])  or visit its website at [email or web address][email or web address] for more information.  Follow-up care is a key part of your treatment and safety. Be sure to make and go to all appointments, and call your doctor if you are having problems. It's also a good idea to know your test results and keep a list of the medicines you take.  How can you care for yourself at home?  ?? Take your medicines exactly as prescribed. Call your doctor if you think you are having a problem with your medicine. You may get medicine for nausea and vomiting if you have these side effects.  ?? Eat healthy food. If you do not feel like eating, try to eat food that has protein and extra calories to keep up your strength  and prevent weight loss. Drink liquid meal replacements for extra calories and protein. Try to eat your main meal early.  ?? Get some physical activity every day, but do not get too tired. Keep doing the hobbies you enjoy as your energy allows.  ?? Take steps to control your stress and workload. Learn relaxation techniques.  ? Share your feelings. Stress and tension affect our emotions. By expressing your feelings to others, you may be able to understand and cope with them.  ? Consider joining a support group. Talking about a problem with your spouse, a good friend, or other people with similar problems is a good way to reduce tension and stress.  ? Express yourself through art. Try writing, dance, art, or crafts to relieve tension. Some dance, writing, or art groups may be available just for people who have cancer.  ? Be kind to your body and mind. Getting enough sleep, eating a healthy diet, and taking time to do things you enjoy can contribute to an overall feeling of balance in your life and help reduce stress.  ? Get help if you need it. Discuss your concerns with your doctor or counselor.  ?? If you are vomiting or have diarrhea:  ? Drink plenty of fluids (enough so that your urine is light yellow or  clear like water) to prevent dehydration. Choose water and other caffeine-free clear liquids. If you have kidney, heart, or liver disease and have to limit fluids, talk with your doctor before you increase the amount of fluids you drink.  ? When you are able to eat, try clear soups, mild foods, and liquids until all symptoms are gone for 12 to 48 hours. Other good choices include dry toast, crackers, cooked cereal, and gelatin dessert, such as Jell-O.  ?? Take care of your urinary tract to prevent problems such as infection, which can be caused by uterine cancer and its treatment. Limit drinks with caffeine, drink plenty of fluids, and urinate every 3 to 4 hours.  ?? If you have not already done so, prepare a list of advance directives. Advance directives are instructions to your doctor and family members about what kind of care you want if you become unable to speak or express yourself.  When should you call for help?  Call 911 anytime you think you may need emergency care. For example, call if:  ?? ?? You passed out (lost consciousness).   ??Call your doctor now or seek immediate medical care if:  ?? ?? You have a fever.   ?? ?? You have abnormal bleeding.   ?? ?? You have new or worse pain.   ?? ?? You think you have an infection.   ?? ?? You have new symptoms, such as a cough, belly pain, vomiting, diarrhea, or a rash.   ??Watch closely for changes in your health, and be sure to contact your doctor if:  ?? ?? You are much more tired than usual.   ?? ?? You have swollen glands in your armpits, groin, or neck.   ?? ?? You do not get better as expected.   Where can you learn more?  Go to [email or web address].  Enter E343 in the search box to learn more about "Uterine Cancer: Care Instructions."  Current as of: September 23, 2016  Content Version: 11.9  ?? 2006-2018 Healthwise, Inc[employer]. Care instructions adapted under license by [employer] (which disclaims liability or warranty  for this information). If you have questions about a medical  condition or this instruction, always ask your healthcare professional. Healthwise, Incorporated disclaims any warranty or liability for your use of this information.

## 2017-09-30 ENCOUNTER — Encounter

## 2017-10-08 ENCOUNTER — Inpatient Hospital Stay: Admit: 2017-10-08 | Payer: MEDICAID | Attending: Family Medicine | Primary: Family Medicine

## 2017-10-08 DIAGNOSIS — Z1231 Encounter for screening mammogram for malignant neoplasm of breast: Secondary | ICD-10-CM

## 2017-12-22 ENCOUNTER — Ambulatory Visit
Admit: 2017-12-22 | Discharge: 2017-12-22 | Payer: PRIVATE HEALTH INSURANCE | Attending: Gynecologic Oncology | Primary: Family Medicine

## 2017-12-22 ENCOUNTER — Inpatient Hospital Stay: Admit: 2017-12-22 | Primary: Family Medicine

## 2017-12-22 DIAGNOSIS — C541 Malignant neoplasm of endometrium: Secondary | ICD-10-CM

## 2017-12-22 LAB — SENTARA SPECIMEN COLLN.

## 2017-12-22 NOTE — Progress Notes (Signed)
[name], a 67 y.o. female,  is here for   Chief Complaint   Patient presents with   ??? Uterine Cancer     3 month surveillance       Visit Vitals  BP 129/82 (BP 1 Location: Left arm, BP Patient Position: Sitting)   Pulse (!) 102   Temp 97.1 ??F (36.2 ??C) (Oral)   Resp 16   Ht 5\' 4"  (1.626 m)   Wt 113.1 kg (249 lb 6.4 oz)   SpO2 96%   BMI 42.81 kg/m??       Patient denies any persistent or worsening abdominal or pelvic pain. Denies any unusual vaginal bleeding, discharge, irritation, or odor. Denies experiencing any constipation or diarrhea. No burning, discomfort, or irritation with urination.    1. Have you been to the ER, urgent care clinic since your last visit?  Hospitalized since your last visit?Yes When: June 2019 Where: [facility] Reason for visit: headache and "muscle concerns"    2. Have you seen or consulted any other health care providers outside of the [facility] since your last visit?  Include any pap smears or colon screening. No

## 2017-12-22 NOTE — Progress Notes (Signed)
[facility]  [address], [place], [place]  [place], [place]  [place], [place]  [phone] FAX [phone]  [facility]        Office Note  Patient ID:  Name: [name]  MRM: [id number]  DOB: [birth year]67 y.o.  Date: 12/22/2017    SUBJECTIVE:    This is a 67 y.o. 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 She is s/p Robotic assisted total laparoscopic hysterectomy, bilateral salpingo-oophorectomy, bilateral pelvic lymph node dissection, washing on 10/27/16. Patient reports intermittent mild cramping pelvic pain. Denies aggravating or alleviating factors. She denies vaginal discharge, bleeding. No changes in bowel habits.     Her pathology revealed    A: UTERUS, CERVIX, BILATERAL FALLOPIAN TUBES AND OVARIES:   PROCEDURE: Total hysterectomy and bilateral salpingo-oophorectomy.   HISTOLOGIC TYPE: Endometrioid carcinoma, NOS.   HISTOLOGIC GRADE: FIGO Grade 2.   MYOMETRIAL INVASION: Present, depth of invasion 5/23 mm (22%).   UTERINE SEROSA INVOLVEMENT: Not identified.   CERVICAL STROMAL INVOLVEMENT: Not identified.   OTHER TISSUE/ORGAN INVOLVEMENT: Not identified.   MARGINS: Not involved: Greater than 1.5 cm distance.   PELVIC WASHINGS ([id number]): No malignant cells found.   LYMPHOVASCULAR INVASION: Not identified.   PATHOLOGIC STAGE CLASSIFICATION: pT1a, N0 (FIGO IA).   ANCILLARY STUDIES:   MMR IHC: See [facility] report [id number].   ER/PR Results:   Estrogen Receptor (ER) Status: Positive (95% of cells staining/strong intensity).   Progesterone Receptor (PgR) Status: Positive (60% of cells staining/moderate intensity).   B: LEFT PELVIC LYMPH NODES, DISSECTION: Not involved (7 NODES).   C: RIGHT PELVIC LYMPH NODES, DISSECTION: Not involved (8 NODES).     Washings, Pelvic   Satisfactory for evaluation.   No malignant cells found.     Medications:      Current Outpatient Medications on File Prior to Visit   Medication Sig Dispense Refill   ??? cholecalciferol (VITAMIN D3) 1,000 unit tablet 1,000 Units.     ??? multivitamin (ONE A DAY) tablet Take 1 Tab by mouth daily.       No current facility-administered medications on file prior to visit.        Allergies:     Allergies   Allergen Reactions   ??? Aspirin Shortness of Breath   ??? Nsaids (Non-Steroidal Anti-Inflammatory Drug) Shortness of Breath   ??? Sulfa (Sulfonamide Antibiotics) Anaphylaxis   ??? Contrast Agent [Iodine] Rash   ??? Pcn [Penicillins] Hives   ??? Percocet [Oxycodone-Acetaminophen] Other (comments)     headaches       OBJECTIVE:    Vitals:   Visit Vitals  BP 129/82 (BP 1 Location: Left arm, BP Patient Position: Sitting)   Pulse (!) 102   Temp 97.1 ??F (36.2 ??C) (Oral)   Resp 16   Ht '5\' 4"'$  (1.626 m)   Wt 113.1 kg (249 lb 6.4 oz)   LMP  (LMP Unknown)   SpO2 96%   BMI 42.81 kg/m??       Physical Examination:    General:  alert, cooperative, no distress   Abdomen: soft, bowel sounds active, non-tender   Incision: Well healed   Pelvic: NEFG, Spec exam revealed vaginal cuff intact, BME revealed vaginal cuff intact, nontender    Rectal: not done   Extremity:   extremities normal, atraumatic, no cyanosis or edema     IMPRESSION/PLAN:    Stage 1A Grade 2 endometrioid  endometrial cancer.  The operative procedures and clinical results previously reviewed with the patient. No adjuvant treatment needed.  Mixed urinary incontinence. Previously referred to urogyn for further evaluation and discussion of management options.   Reviewed signs and symptoms of recurrence and discussed surveillance interval.  I will see the patient back in 4 months for routine surveillance. The patient is advised to call our office with any problems or concerns.    [facility]anne [facility]  Gynecologic Oncology  12/22/2017/11:12 AM

## 2017-12-22 NOTE — Patient Instructions (Signed)
Pelvic Exam: Care Instructions  Your Care Instructions    When your doctor examines all of your pelvic organs, it's called a pelvic exam. Two good reasons to have this kind of exam are to check for sexually transmitted infections (STIs) and to get a Pap test. A Pap test is also called a Pap smear. It checks for early changes that can lead to cancer of the cervix.  Sometimes a pelvic exam is part of a regular checkup. In this case, you can do some things to make your test results as accurate as possible.  ?? Try to schedule the exam when you don't have your period.  ?? Don't use douches, tampons, or vaginal medicines, sprays, or powders for 24 hours before your exam.  ?? Don't have sex for 24 hours before your exam.  Other times, women have this kind of exam at any time of the month. This is because they have pelvic pain, bleeding, or discharge. Or they may have another pelvic problem.  Before your exam, it's important to share some information with your doctor. For example, if you are a survivor of rape or sexual abuse, you can talk about any concerns you may have. Your doctor will also want to know if you are pregnant or use birth control. And he or she will want to hear about any problems, surgeries, or procedures you have had in your pelvic area. You will also need to tell your doctor when your last period was.  Follow-up care is a key part of your treatment and safety. Be sure to make and go to all appointments, and call your doctor if you are having problems. It's also a good idea to know your test results and keep a list of the medicines you take.  How is a pelvic exam done?  ?? During a pelvic exam, you will:  ? Take off your clothes below the waist. You will get a paper or cloth cover to put over the lower half of your body.  ? Lie on your back on an exam table. Your feet will be raised above you. Stirrups will support your feet.  ?? The doctor will:   ? Ask you to relax your knees. Your knees need to lean out, toward the walls.  ? Check the opening of your vagina for sores or swelling.  ? Gently put a tool called a speculum into your vagina. It opens the vagina a little bit. You will feel some pressure. But if you are relaxed, it will not hurt. It lets your doctor see inside the vagina.  ? Use a small brush, spatula, or swab to get a sample of cells, if you are having a Pap test or culture. The doctor then removes the speculum.  ? Put on gloves and put one or two fingers of one hand into your vagina. The other hand goes on your lower belly. This lets your doctor feel your pelvic organs. You will probably feel some pressure. Try to stay relaxed.  ? Put one gloved finger into your rectum and one into your vagina, if needed. This can also help check your pelvic organs.  This exam takes about 10 minutes. At the end, you will get a washcloth or tissue to clean your vaginal area. It's normal to have some discharge after this exam. You can then get dressed.  Some test results may be ready right away. But results from a culture or a Pap test may take several days or a   few weeks.  Why should you have a pelvic exam?  ?? You want to have recommended screening tests. This includes a Pap test.  ?? You think you have a vaginal infection. Signs include itching, burning, or unusual discharge.  ?? You might have been exposed to a sexually transmitted infection (STI), such as chlamydia or herpes.  ?? You have vaginal bleeding that is not part of your normal menstrual period.  ?? You have pain in your belly or pelvis.  ?? You have been sexually assaulted. A pelvic exam lets your doctor collect evidence and check for STIs.  ?? You are pregnant.  ?? You are having trouble getting pregnant.  What are the risks of a pelvic exam?  There are no risks from a pelvic exam.  When should you call for help?  Watch closely for changes in your health, and be sure to contact your  doctor if you have any problems.  Where can you learn more?  Go to http://www.healthwise.net/GoodHelpConnections.  Enter M421 in the search box to learn more about "Pelvic Exam: Care Instructions."  Current as of: Nov 10, 2016  Content Version: 11.9  ?? 2006-2018 [employer], [employer]. Care instructions adapted under license by [employer] (which disclaims liability or warranty for this information). If you have questions about a medical condition or this instruction, always ask your healthcare professional. [employer] disclaims any warranty or liability for your use of this information.

## 2018-01-06 ENCOUNTER — Encounter

## 2018-01-21 ENCOUNTER — Encounter

## 2018-01-21 ENCOUNTER — Inpatient Hospital Stay: Admit: 2018-01-21 | Payer: MEDICAID | Attending: Family Medicine | Primary: Family Medicine

## 2018-01-21 DIAGNOSIS — N644 Mastodynia: Secondary | ICD-10-CM

## 2018-03-27 ENCOUNTER — Emergency Department: Admit: 2018-03-27 | Payer: MEDICAID | Primary: Family Medicine

## 2018-03-27 ENCOUNTER — Inpatient Hospital Stay
Admit: 2018-03-27 | Discharge: 2018-03-28 | Disposition: A | Discharge status: Home / Self Care | Payer: MEDICAID | Attending: Emergency Medicine

## 2018-03-27 DIAGNOSIS — R1031 Right lower quadrant pain: Secondary | ICD-10-CM

## 2018-03-27 LAB — CBC WITH AUTOMATED DIFF
ABS. BASOPHILS: 0 10*3/uL (ref 0.0–0.1)
ABS. EOSINOPHILS: 0.2 10*3/uL (ref 0.0–0.4)
ABS. LYMPHOCYTES: 2.8 10*3/uL (ref 0.9–3.6)
ABS. MONOCYTES: 0.5 10*3/uL (ref 0.05–1.2)
ABS. NEUTROPHILS: 3.2 10*3/uL (ref 1.8–8.0)
BASOPHILS: 0 % (ref 0–2)
EOSINOPHILS: 3 % (ref 0–5)
HCT: 37.6 % (ref 35.0–45.0)
HGB: 11.9 g/dL — ABNORMAL LOW (ref 12.0–16.0)
LYMPHOCYTES: 42 % (ref 21–52)
MCH: 27.6 PG (ref 24.0–34.0)
MCHC: 31.6 g/dL (ref 31.0–37.0)
MCV: 87.2 FL (ref 74.0–97.0)
MONOCYTES: 7 % (ref 3–10)
MPV: 9.8 FL (ref 9.2–11.8)
NEUTROPHILS: 48 % (ref 40–73)
PLATELET: 296 10*3/uL (ref 135–420)
RBC: 4.31 M/uL (ref 4.20–5.30)
RDW: 15.5 % — ABNORMAL HIGH (ref 11.6–14.5)
WBC: 6.7 10*3/uL (ref 4.6–13.2)

## 2018-03-27 LAB — METABOLIC PANEL, COMPREHENSIVE
A-G Ratio: 0.8 (ref 0.8–1.7)
ALT (SGPT): 30 U/L (ref 13–56)
AST (SGOT): 29 U/L (ref 10–38)
Albumin: 3.3 g/dL — ABNORMAL LOW (ref 3.4–5.0)
Alk. phosphatase: 138 U/L — ABNORMAL HIGH (ref 45–117)
Anion gap: 3 mmol/L (ref 3.0–18)
BUN/Creatinine ratio: 11 — ABNORMAL LOW (ref 12–20)
BUN: 10 MG/DL (ref 7.0–18)
Bilirubin, total: 0.2 MG/DL (ref 0.2–1.0)
CO2: 30 mmol/L (ref 21–32)
Calcium: 9.3 MG/DL (ref 8.5–10.1)
Chloride: 108 mmol/L (ref 100–111)
Creatinine: 0.92 MG/DL (ref 0.6–1.3)
GFR est AA: >60 mL/min/{1.73_m2} (ref >60)
GFR est non-AA: >60 mL/min/{1.73_m2} (ref >60)
Globulin: 4.4 g/dL — ABNORMAL HIGH (ref 2.0–4.0)
Glucose: 100 mg/dL — ABNORMAL HIGH (ref 74–99)
Potassium: 4.3 mmol/L (ref 3.5–5.5)
Protein, total: 7.7 g/dL (ref 6.4–8.2)
Sodium: 141 mmol/L (ref 136–145)

## 2018-03-27 LAB — URINALYSIS W/ RFLX MICROSCOPIC
Bilirubin: NEGATIVE
Blood: NEGATIVE
Glucose: NEGATIVE mg/dL
Ketone: NEGATIVE mg/dL
Leukocyte Esterase: NEGATIVE
Nitrites: NEGATIVE
Protein: NEGATIVE mg/dL
Specific gravity: 1.007 (ref 1.005–1.030)
Urobilinogen: 1 EU/dL (ref 0.2–1.0)
pH (UA): 6.5 (ref 5.0–8.0)

## 2018-03-27 LAB — LIPASE: Lipase: 76 U/L (ref 73–393)

## 2018-03-27 MED ORDER — ONDANSETRON (PF) 4 MG/2 ML INJECTION
4 mg/2 mL | INTRAMUSCULAR | Status: AC
Start: 2018-03-27 — End: 2018-03-27
  Administered 2018-03-27: 22:00:00 via INTRAVENOUS

## 2018-03-27 MED ORDER — SODIUM CHLORIDE 0.9 % IV
INTRAVENOUS | Status: DC
Start: 2018-03-27 — End: 2018-03-28
  Administered 2018-03-27: 22:00:00 via INTRAVENOUS

## 2018-03-27 MED FILL — SODIUM CHLORIDE 0.9 % IV: INTRAVENOUS | Qty: 1000

## 2018-03-27 MED FILL — ONDANSETRON (PF) 4 MG/2 ML INJECTION: 4 mg/2 mL | INTRAMUSCULAR | Qty: 2

## 2018-03-27 NOTE — ED Notes (Signed)
Pt OTF to CT.

## 2018-03-27 NOTE — ED Provider Notes (Signed)
EMERGENCY DEPARTMENT HISTORY AND PHYSICAL EXAM    Date: 03/27/2018  Patient Name: [name]    History of Presenting Illness     Chief Complaint   Patient presents with   ??? Abdominal Pain         History Provided By: Patient    [name] is a 67 y.o. female with PMHX of endometrial cancer who presents to the emergency department C/O right-sided abdominal pain.  Patient states she was prescribed antibiotics for a sinus infection 6 days ago, began having pain diffusely throughout her abdomen 5 days ago so she stopped taking her antibiotic.  Patient states the pain gradually localized to the right lower quadrant.  Patient has had one episode of vomiting without blood, denies current nausea, vomiting, pain with urination, fever, chills, chest pain, difficulty breathing.       PCP: Christa See, MD    Current Outpatient Medications   Medication Sig Dispense Refill   ??? polyethylene glycol (MIRALAX) 17 gram/dose powder Take 17 g by mouth daily. 1 tablespoon with 8 oz of water daily 510 g 0   ??? dicyclomine (BENTYL) 20 mg tablet Take 1 Tab by mouth every six (6) hours for 20 doses. 20 Tab 0   ??? cholecalciferol (VITAMIN D3) 1,000 unit tablet 1,000 Units.     ??? multivitamin (ONE A DAY) tablet Take 1 Tab by mouth daily.         Past History     Past Medical History:  Past Medical History:   Diagnosis Date   ??? Borderline diabetes mellitus    ??? Cancer ([facility]) 09/2016    Uterine   ??? GERD (gastroesophageal reflux disease)     occasional, takes Prilosec prn       Past Surgical History:  Past Surgical History:   Procedure Laterality Date   ??? HX HYSTERECTOMY     ??? HX ORTHOPAEDIC Right     age 30   ??? HX OTHER SURGICAL Right     knee   ??? HX OTHER SURGICAL Right     foot    ??? HX TONSILLECTOMY         Family History:  Family History   Problem Relation Age of Onset   ??? Breast Cancer Mother    ??? Diabetes Mother    ??? Heart Disease Mother    ??? Diabetes Other         family hx of    ??? Heart Disease Maternal Grandmother        Social History:  Social History     Tobacco Use   ??? Smoking status: Former Smoker   ??? Smokeless tobacco: Never Used   ??? Tobacco comment: quit over 30 years ago   Substance Use Topics   ??? Alcohol use: No   ??? Drug use: No       Allergies:  Allergies   Allergen Reactions   ??? Aspirin Shortness of Breath   ??? Nsaids (Non-Steroidal Anti-Inflammatory Drug) Shortness of Breath   ??? Sulfa (Sulfonamide Antibiotics) Anaphylaxis   ??? Contrast Agent [Iodine] Rash   ??? Pcn [Penicillins] Hives   ??? Percocet [Oxycodone-Acetaminophen] Other (comments)     headaches         Review of Systems   Review of Systems   Constitutional: Negative for chills and fever.   Gastrointestinal: Positive for abdominal pain and vomiting. Negative for constipation, diarrhea and nausea.   Genitourinary: Negative for dysuria.   Neurological: Negative for dizziness.  All other systems reviewed and are negative.      Physical Exam     Vitals:    03/27/18 1740 03/27/18 1905 03/27/18 1930 03/27/18 2000   BP: 131/68 127/70 119/67 126/66   Pulse: (!) 105 90     Resp: 18 16     Temp: 98.1 ??F (36.7 ??C) 98.3 ??F (36.8 ??C)     SpO2: 100% 99% 99% 100%   Weight: 113.4 kg (250 lb)      Height: _0  (1.6 m)        Physical Exam   Nursing note and vitals reviewed.      CONSTITUTIONAL: Alert, in no apparent distress; well-developed; well-nourished.  HEAD:  Normocephalic, atraumatic  EYES: PERRL; EOM's intact.  Neck:  No JVD, supple without lymphadenopathy  RESP: Chest clear, equal breath sounds.  CV: S1 and S2 WNL; No murmurs, gallops or rubs.  GI: Normal bowel sounds, abdomen soft with tenderness to RLQ, right mid abdomen. No masses or organomegaly.  GU: No costo-vertebral angle tenderness.  BACK:  Non-tender  UPPER EXT:  Normal inspection  LOWER EXT: No edema, no calf tenderness.  Distal pulses intact.  NEURO: CN intact, reflexes 2/4 and sym, strength 5/5 and sym, sensation intact.  SKIN: normal for age and stage.    PSYCH:  Alert and oriented, normal affect.      Diagnostic Study Results     Labs -     Recent Results (from the past 12 hour(s))   METABOLIC PANEL, COMPREHENSIVE    Collection Time: 03/27/18  6:00 PM   Result Value Ref Range    Sodium 141 136 - 145 mmol/L    Potassium 4.3 3.5 - 5.5 mmol/L    Chloride 108 100 - 111 mmol/L    CO2 30 21 - 32 mmol/L    Anion gap 3 3.0 - 18 mmol/L    Glucose 100 (H) 74 - 99 mg/dL    BUN 10 7.0 - 18 MG/DL    Creatinine 0.92 0.6 - 1.3 MG/DL    BUN/Creatinine ratio 11 (L) 12 - 20      GFR est AA >60 >60 ml/min/1.22m    GFR est non-AA >60 >60 ml/min/1.748m   Calcium 9.3 8.5 - 10.1 MG/DL    Bilirubin, total 0.2 0.2 - 1.0 MG/DL    ALT (SGPT) 30 13 - 56 U/L    AST (SGOT) 29 10 - 38 U/L    Alk. phosphatase 138 (H) 45 - 117 U/L    Protein, total 7.7 6.4 - 8.2 g/dL    Albumin 3.3 (L) 3.4 - 5.0 g/dL    Globulin 4.4 (H) 2.0 - 4.0 g/dL    A-G Ratio 0.8 0.8 - 1.7     LIPASE    Collection Time: 03/27/18  6:00 PM   Result Value Ref Range    Lipase 76 73 - 393 U/L   CBC WITH AUTOMATED DIFF    Collection Time: 03/27/18  6:00 PM   Result Value Ref Range    WBC 6.7 4.6 - 13.2 K/uL    RBC 4.31 4.20 - 5.30 M/uL    HGB 11.9 (L) 12.0 - 16.0 g/dL    HCT 37.6 35.0 - 45.0 %    MCV 87.2 74.0 - 97.0 FL    MCH 27.6 24.0 - 34.0 PG    MCHC 31.6 31.0 - 37.0 g/dL    RDW 15.5 (H) 11.6 - 14.5 %    PLATELET 296 135 -  420 K/uL    MPV 9.8 9.2 - 11.8 FL    NEUTROPHILS 48 40 - 73 %    LYMPHOCYTES 42 21 - 52 %    MONOCYTES 7 3 - 10 %    EOSINOPHILS 3 0 - 5 %    BASOPHILS 0 0 - 2 %    ABS. NEUTROPHILS 3.2 1.8 - 8.0 K/UL    ABS. LYMPHOCYTES 2.8 0.9 - 3.6 K/UL    ABS. MONOCYTES 0.5 0.05 - 1.2 K/UL    ABS. EOSINOPHILS 0.2 0.0 - 0.4 K/UL    ABS. BASOPHILS 0.0 0.0 - 0.1 K/UL    DF AUTOMATED     URINALYSIS W/ RFLX MICROSCOPIC    Collection Time: 03/27/18  6:05 PM   Result Value Ref Range    Color YELLOW      Appearance CLEAR      Specific gravity 1.007 1.005 - 1.030      pH (UA) 6.5 5.0 - 8.0      Protein NEGATIVE  NEG mg/dL     Glucose NEGATIVE  NEG mg/dL    Ketone NEGATIVE  NEG mg/dL    Bilirubin NEGATIVE  NEG      Blood NEGATIVE  NEG      Urobilinogen 1.0 0.2 - 1.0 EU/dL    Nitrites NEGATIVE  NEG      Leukocyte Esterase NEGATIVE  NEG         Radiologic Studies -   CT ABD PELV WO CONT   Final Result   IMPRESSION:         1. Hiatal hernia with patulous esophagus compatible with reflux disease.      2. Low-attenuation right adrenal nodule likely adenoma. This could be further   evaluated with metabolic laboratory testing.      3. Hysterectomy. Small umbilical hernia.        CT Results  (Last 48 hours)               03/27/18 1957  CT ABD PELV WO CONT Final result    Impression:  IMPRESSION:           1. Hiatal hernia with patulous esophagus compatible with reflux disease.       2. Low-attenuation right adrenal nodule likely adenoma. This could be further   evaluated with metabolic laboratory testing.       3. Hysterectomy. Small umbilical hernia.       Narrative:  EXAM: CT ABDOMEN AND PELVIS WITHOUT CONTRAST       INDICATION: Progressive right lower quadrant pain, nausea and vomiting for one   day.       COMPARISON: None.       TECHNIQUE: Axial CT imaging of the abdomen and pelvis was performed without   intravenous contrast. Multiplanar reformats were generated. One or more dose   reduction techniques were used on this CT: automated exposure control,   adjustment of the mAs and/or kVp according to patient size, and iterative   reconstruction techniques.  The specific techniques used on this CT exam have   been documented in the patient's electronic medical record. Digital Imaging and   Communications in Medicine (DICOM) format image data are available to   nonaffiliated external healthcare facilities or entities on a secure, media   free, reciprocally searchable basis with patient authorization for at least a   48-monthperiod after this study       _______________       FINDINGS:  LOWER CHEST: Atelectatic or fibrotic streak in lingula. Patulous esophagus   probably due to reflux disease.       LIVER, BILIARY: Liver is normal. No biliary dilation. Gallbladder unremarkable.   Note that CT is relatively insensitive for detection of gallstones.       PANCREAS: Normal.       SPLEEN: Normal.       ADRENALS: Low-attenuation right adrenal nodule measures about 2.2 x 3.1 cm   likely benign adenoma. Normal left adrenal gland.       KIDNEYS/URETERS: Kidneys appear normal without hydronephrosis or calculus.   Urinary bladder decompressed and cannot be well evaluated.       LYMPH NODES: No enlarged lymph nodes.       GASTROINTESTINAL TRACT: No bowel dilation or wall thickening. Normal appendix.       PELVIC ORGANS: Hysterectomy.       VASCULATURE: Unremarkable.       BONES: No acute or aggressive osseous abnormalities identified.       OTHER: Small fat-containing umbilical hernia.       _______________               CXR Results  (Last 48 hours)    None          Medications given in the ED-  Medications   ondansetron (ZOFRAN) injection 4 mg (4 mg IntraVENous Given 03/27/18 1821)         Medical Decision Making   I am the first provider for this patient.    I reviewed the vital signs, available nursing notes, past medical history, past surgical history, family history and social history.    Vital Signs-Reviewed the patient's vital signs.    Pulse Oximetry Analysis - 100% on RA     Cardiac Monitor:  Rate: 105 bpm  Rhythm: Sinus tachycardia    Records Reviewed: Old Medical Records    Procedures:  Procedures    Provider notes/MDM:  DDX: Consider diverticulitis, acute appendicitis, bowel obstruction - imaging without acute findings. Pt also with constipation, will treat for this.    ED Course:   5:45 PM  Initial assessment performed. The patients presenting problems have been discussed, and they are in agreement with the care plan formulated and  outlined with them.  I have encouraged them to ask questions as they arise throughout their visit.    8:37 PM  Reevaluated pt, reviewed labs and imaging - no evidence of acute appendicitis.        Diagnosis and Disposition       Discussion: 68 y.o. female with right-sided lower abdominal pain, labs and imaging without any acute findings.  Patient advised she will be treated with Bentyl and MiraLAX for symptomatic relief, recommend good hydration, and follow-up with primary doctor for further evaluation if symptoms do not improve.  Patient verbalized understanding and agreed with plan.  Return precautions discussed.     DISCHARGE NOTE:  [name]  results have been reviewed with her.  She has been counseled regarding her diagnosis, treatment, and plan.  She verbally conveys understanding and agreement of the signs, symptoms, diagnosis, treatment and prognosis and additionally agrees to follow up as discussed.  She also agrees with the care-plan and conveys that all of her questions have been answered.  I have also provided discharge instructions for her that include: educational information regarding their diagnosis and treatment, and list of reasons why they would want to return to the ED prior to their follow-up  appointment, should her condition change. She has been provided with education for proper emergency department utilization.     CLINICAL IMPRESSION:    1. Abdominal pain, right lower quadrant    2. Constipation, unspecified constipation type        PLAN:  1. D/C Home  2.   Discharge Medication List as of 03/27/2018  8:46 PM      START taking these medications    Details   polyethylene glycol (MIRALAX) 17 gram/dose powder Take 17 g by mouth daily. 1 tablespoon with 8 oz of water daily, Print, Disp-510 g, R-0      dicyclomine (BENTYL) 20 mg tablet Take 1 Tab by mouth every six (6) hours for 20 doses., Print, Disp-20 Tab, R-0         CONTINUE these medications which have NOT CHANGED     Details   cholecalciferol (VITAMIN D3) 1,000 unit tablet 1,000 Units., Historical Med      multivitamin (ONE A DAY) tablet Take 1 Tab by mouth daily., Historical Med           3.   Follow-up Information     Follow up With Specialties Details Why Contact Info    Christa See, MD [facility] Schedule an appointment as soon as possible for a visit in 2 days  [place] [id number]  [phone]      [facility] EMERGENCY DEPT Emergency Medicine Go to  As needed, If symptoms worsen [address]  [place] [address]  [phone]          Note completed by Fay Records, PA-C        Please note that this dictation was completed with Dragon, the computer voice recognition software.  Quite often unanticipated grammatical, syntax, homophones, and other interpretive errors are inadvertently transcribed by the computer software.  Please disregard these errors.  Please excuse any errors that have escaped final proofreading.

## 2018-03-27 NOTE — ED Notes (Signed)
Discharge instructions reviewed with opportunity for questions provided. Pt vocalized understanding. Armband removed and shredded. Pt stable condition at time of discharge.

## 2018-03-27 NOTE — ED Notes (Signed)
Pt hourly rounding competed.  Safety   Pt (x) resting on stretcher with side rails up and call bell in reach.    () in chair    () in parents arms.  Toileting   Pt offered ()Bedpan     ()Assistance to Restroom     ()Urinal  Ongoing Updates  Updated on plan of care and status of test results.  Pain Management  Inquired as to comfort and offered comfort measures:    (x) warm blankets   (x) dimmed lights    Pt resting in stretcher in NAD. Provided update. Notified provider results are resulted. Provider at bedside to update.

## 2018-03-27 NOTE — ED Notes (Signed)
Verbal report given to [facility]

## 2018-03-27 NOTE — ED Triage Notes (Signed)
Pt started with right lower abdominal pain and nausea and vomiting last night. Pain has worsened today

## 2018-03-28 MED ORDER — POLYETHYLENE GLYCOL 3350 100 % ORAL POWDER
17 gram/dose | Freq: Every day | ORAL | 0 refills | Status: AC
Start: 2018-03-28 — End: ?

## 2018-03-28 MED ORDER — DICYCLOMINE 20 MG TAB
20 mg | ORAL_TABLET | Freq: Four times a day (QID) | ORAL | 0 refills | Status: AC
Start: 2018-03-28 — End: 2018-04-01

## 2018-09-10 ENCOUNTER — Ambulatory Visit
Admit: 2018-09-10 | Discharge: 2018-09-10 | Payer: PRIVATE HEALTH INSURANCE | Attending: Gynecologic Oncology | Primary: Family Medicine

## 2018-09-10 DIAGNOSIS — C541 Malignant neoplasm of endometrium: Secondary | ICD-10-CM

## 2018-09-10 NOTE — Progress Notes (Signed)
[facility]  [address] [place], [place]  [place], [place]  [place], [place]  [phone] FAX [phone]  Leeanne Rio [facility]        Office Note  Patient ID:  Name: [name]  MRM: [id number]  DOB: 1950-12-06/68 y.o.  Date: 09/10/2018    SUBJECTIVE:    This is a 68 y.o. 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 She is s/p Robotic assisted total laparoscopic hysterectomy, bilateral salpingo-oophorectomy, bilateral pelvic lymph node dissection, washing on 10/27/16. Denies vaginal bleeding, change in vaginal discharge, new abdominopelvic pain, change in bowel habits. She reports worsening stress urinary incontinence.       Her pathology revealed    A: UTERUS, CERVIX, BILATERAL FALLOPIAN TUBES AND OVARIES:   PROCEDURE: Total hysterectomy and bilateral salpingo-oophorectomy.   HISTOLOGIC TYPE: Endometrioid carcinoma, NOS.   HISTOLOGIC GRADE: FIGO Grade 2.   MYOMETRIAL INVASION: Present, depth of invasion 5/23 mm (22%).   UTERINE SEROSA INVOLVEMENT: Not identified.   CERVICAL STROMAL INVOLVEMENT: Not identified.   OTHER TISSUE/ORGAN INVOLVEMENT: Not identified.   MARGINS: Not involved: Greater than 1.5 cm distance.   PELVIC WASHINGS ([id number]): No malignant cells found.   LYMPHOVASCULAR INVASION: Not identified.   PATHOLOGIC STAGE CLASSIFICATION: pT1a, N0 (FIGO IA).   ANCILLARY STUDIES:   MMR IHC: See [facility] report [id number].   ER/PR Results:   Estrogen Receptor (ER) Status: Positive (95% of cells staining/strong intensity).   Progesterone Receptor (PgR) Status: Positive (60% of cells staining/moderate intensity).   B: LEFT PELVIC LYMPH NODES, DISSECTION: Not involved (7 NODES).   C: RIGHT PELVIC LYMPH NODES, DISSECTION: Not involved (8 NODES).     Washings, Pelvic   Satisfactory for evaluation.   No malignant cells found.     Medications:      Current Outpatient Medications on File Prior to Visit   Medication Sig Dispense Refill   ??? cyanocobalamin (VITAMIN B12) 1,000 mcg/mL injection      ??? ferrous sulfate 324 mg (65 mg iron) tablet Take  by mouth Daily (before breakfast).     ??? cholecalciferol (VITAMIN D3) 1,000 unit tablet 1,000 Units.     ??? multivitamin (ONE A DAY) tablet Take 1 Tab by mouth daily.     ??? polyethylene glycol (MIRALAX) 17 gram/dose powder Take 17 g by mouth daily. 1 tablespoon with 8 oz of water daily 510 g 0     No current facility-administered medications on file prior to visit.        Allergies:     Allergies   Allergen Reactions   ??? Aspirin Shortness of Breath   ??? Nsaids (Non-Steroidal Anti-Inflammatory Drug) Shortness of Breath   ??? Sulfa (Sulfonamide Antibiotics) Anaphylaxis   ??? Contrast Agent [Iodine] Rash   ??? Pcn [Penicillins] Hives   ??? Percocet [Oxycodone-Acetaminophen] Other (comments)     headaches       OBJECTIVE:    Vitals:   Visit Vitals  BP 140/90 (BP 1 Location: Right arm, BP Patient Position: Sitting)   Pulse (!) 114   Temp 97.3 ??F (36.3 ??C) (Oral)   Resp 16   Ht '5\' 3"'  (1.6 m)   Wt 113.2 kg (249 lb 9.6 oz)   LMP  (LMP Unknown)   SpO2 97%   BMI 44.21 kg/m??       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  The operative procedures and clinical results previously reviewed with the patient. No adjuvant treatment needed.  Mixed urinary incontinence. Previously referred to urogyn for further evaluation and discussion of management options, referral re-ordered today.   Reviewed signs and symptoms of recurrence and discussed surveillance interval.   I will see the patient back in 4 months for routine surveillance. The patient is advised to call our office with any problems or concerns.    Leeanne Rio [facility]  Gynecologic Oncology  09/10/2018/11:12 AM

## 2018-09-10 NOTE — Progress Notes (Signed)
[name] is a 68 y.o. female presents in office for endometrial cancer.    Chief Complaint   Patient presents with   ??? Uterine Cancer     Endometrial cancer        Do you have any unusual vaginal bleeding, discharge or irritation? No  Do you have any changes in your bowel movements? No  Have you been experiencing nausea or vomiting? No  Have you been experiencing any continuous or worsening abdominal pain? No  Any urinary burning? Pt c/o worsening incontinence, she was not seen by urology and requests a new referral.    Visit Vitals  BP 140/90 (BP 1 Location: Right arm, BP Patient Position: Sitting)   Pulse (!) 114   Resp 16   Ht 5\' 3"  (1.6 m)   Wt 113.2 kg (249 lb 9.6 oz)   SpO2 97%   BMI 44.21 kg/m??         Health Maintenance Due   Topic Date Due   ??? Hepatitis C Screening  Jan 09, 1951   ??? Shingrix Vaccine Age 3> (1 of 2) 12/27/2002   ??? FOBT Q1Y Age 3-75  12/27/2002   ??? GLAUCOMA SCREENING Q2Y  12/26/2017   ??? Bone Densitometry (Dexa) Screening  12/26/2017   ??? Pneumococcal 65+ years (1 of 1 - PPSV23) 12/26/2017   ??? Influenza Age 33 to Adult  01/28/2018         1. Have you been to the ER, urgent care clinic since your last visit?  Hospitalized since your last visit? Pt seen in ED for panic attack in December.    2. Have you seen or consulted any other health care providers outside of the [place] since your last visit?  Include any pap smears or colon screening. No    3 most recent PHQ Screens 09/10/2018   Little interest or pleasure in doing things Not at all   Feeling down, depressed, irritable, or hopeless Several days   Total Score PHQ 2 1       Abuse Screening Questionnaire 12/22/2017   Do you ever feel afraid of your partner? N   Are you in a relationship with someone who physically or mentally threatens you? N   Is it safe for you to go home? Y       Fall Risk Assessment, last 12 mths 09/10/2018   Able to walk? Yes   Fall in past 12 months? No   Fall with injury? -    Number of falls in past 12 months -   Fall Risk Score -       Learning Assessment 05/09/2016   PRIMARY LEARNER Patient   HIGHEST LEVEL OF EDUCATION - PRIMARY LEARNER  SOME COLLEGE   BARRIERS PRIMARY LEARNER NONE   CO-LEARNER CAREGIVER No   PRIMARY LANGUAGE ENGLISH   LEARNER PREFERENCE PRIMARY DEMONSTRATION   ANSWERED BY patient   RELATIONSHIP SELF

## 2018-10-12 NOTE — Telephone Encounter (Signed)
Called patient to schedule telemedicine visit as referred by Chrissie Noa NP. Patient did not answer. Left voicemail for patient with office phone number and encouraged call back to schedule appt.     Kinnie Scales

## 2018-11-08 ENCOUNTER — Encounter

## 2019-01-24 ENCOUNTER — Encounter

## 2019-03-01 ENCOUNTER — Encounter: Attending: Women's Health | Primary: Family Medicine

## 2019-03-01 ENCOUNTER — Encounter: Attending: Gynecologic Oncology | Primary: Family Medicine

## 2019-03-01 NOTE — Telephone Encounter (Signed)
Left voicemail for patient to call to reschedule from no show appointment today, 03/01/2019. Letter will be mailed.

## 2019-03-01 NOTE — Progress Notes (Deleted)
[facility]  [address] [place], [place]  [place], [place]  [place], [place]  [phone] FAX [phone]  [facility]        Office Note  Patient ID:  Name: [name]  MRM: [id number]  DOB: 06/10/1951/68 y.o.  Date: 03/01/2019    SUBJECTIVE:    This is a 68 y.o. 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 She is s/p Robotic assisted total laparoscopic hysterectomy, bilateral salpingo-oophorectomy, bilateral pelvic lymph node dissection, washing on 10/27/16. Denies vaginal bleeding, change in vaginal discharge, new abdominopelvic pain, change in bowel habits. She reports worsening stress urinary incontinence. ***      Her pathology revealed    A: UTERUS, CERVIX, BILATERAL FALLOPIAN TUBES AND OVARIES:   PROCEDURE: Total hysterectomy and bilateral salpingo-oophorectomy.   HISTOLOGIC TYPE: Endometrioid carcinoma, NOS.   HISTOLOGIC GRADE: FIGO Grade 2.   MYOMETRIAL INVASION: Present, depth of invasion 5/23 mm (22%).   UTERINE SEROSA INVOLVEMENT: Not identified.   CERVICAL STROMAL INVOLVEMENT: Not identified.   OTHER TISSUE/ORGAN INVOLVEMENT: Not identified.   MARGINS: Not involved: Greater than 1.5 cm distance.   PELVIC WASHINGS ([id number]): No malignant cells found.   LYMPHOVASCULAR INVASION: Not identified.   PATHOLOGIC STAGE CLASSIFICATION: pT1a, N0 (FIGO IA).   ANCILLARY STUDIES:   MMR IHC: See [facility] report [id number].   ER/PR Results:   Estrogen Receptor (ER) Status: Positive (95% of cells staining/strong intensity).   Progesterone Receptor (PgR) Status: Positive (60% of cells staining/moderate intensity).   B: LEFT PELVIC LYMPH NODES, DISSECTION: Not involved (7 NODES).   C: RIGHT PELVIC LYMPH NODES, DISSECTION: Not involved (8 NODES).     Washings, Pelvic   Satisfactory for evaluation.   No malignant cells found.     Medications:      Current Outpatient Medications on File Prior to Visit   Medication Sig Dispense Refill   ??? cyanocobalamin (VITAMIN B12) 1,000 mcg/mL injection      ??? ferrous sulfate 324 mg (65 mg iron) tablet Take  by mouth Daily (before breakfast).     ??? polyethylene glycol (MIRALAX) 17 gram/dose powder Take 17 g by mouth daily. 1 tablespoon with 8 oz of water daily 510 g 0   ??? cholecalciferol (VITAMIN D3) 1,000 unit tablet 1,000 Units.     ??? multivitamin (ONE A DAY) tablet Take 1 Tab by mouth daily.       No current facility-administered medications on file prior to visit.        Allergies:     Allergies   Allergen Reactions   ??? Aspirin Shortness of Breath   ??? Nsaids (Non-Steroidal Anti-Inflammatory Drug) Shortness of Breath   ??? Sulfa (Sulfonamide Antibiotics) Anaphylaxis   ??? Contrast Agent [Iodine] Rash   ??? Pcn [Penicillins] Hives   ??? Percocet [Oxycodone-Acetaminophen] Other (comments)     headaches       OBJECTIVE:    Vitals:   Visit Vitals  LMP  (LMP Unknown)       Physical Examination:    General:  alert, cooperative, no distress   Abdomen: soft, bowel sounds active, non-tender   Incision: Well healed   Pelvic: NEFG, Spec exam revealed vaginal cuff intact, BME revealed vaginal cuff intact, nontender    Rectal: not done   Extremity:   extremities normal, atraumatic, no cyanosis or edema     IMPRESSION/PLAN:    Stage 1A  Grade 2 endometrioid endometrial cancer.  The operative procedures and clinical results previously reviewed with the patient. No adjuvant treatment needed.  Mixed urinary incontinence. Previously referred to urogyn for further evaluation and discussion of management options, referral re-ordered today. ***  Reviewed signs and symptoms of recurrence and discussed surveillance interval.  I will see the patient back in 6 months for routine surveillance. The patient is advised to call our office with any problems or concerns.    Leeanne Rio [facility]  Gynecologic Oncology  03/01/2019/11:12 AM

## 2019-05-16 ENCOUNTER — Ambulatory Visit: Admit: 2019-05-16 | Discharge: 2019-05-16 | Payer: MEDICAID | Attending: Gynecologic Oncology | Primary: Family Medicine

## 2019-05-16 DIAGNOSIS — C541 Malignant neoplasm of endometrium: Secondary | ICD-10-CM

## 2019-05-16 NOTE — Progress Notes (Signed)
[facility]  [address] [place], [place]  [place], [place]  [place], [place]  [phone] FAX [phone]          Office Note  Patient ID:  Name: [name]  MRM: [id number]  DOB: December 27, 1950/68 y.o.  Date: 05/16/2019    SUBJECTIVE:    This is a 68 y.o. 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 She is s/p Robotic assisted total laparoscopic hysterectomy, bilateral salpingo-oophorectomy, bilateral pelvic lymph node dissection, washing on 10/27/16. Denies vaginal bleeding, change in vaginal discharge, new abdominopelvic pain, change in bowel habits. She still reports worsening stress urinary incontinence. Mother passed away 2 months ago.       Her pathology revealed    A: UTERUS, CERVIX, BILATERAL FALLOPIAN TUBES AND OVARIES:   PROCEDURE: Total hysterectomy and bilateral salpingo-oophorectomy.   HISTOLOGIC TYPE: Endometrioid carcinoma, NOS.   HISTOLOGIC GRADE: FIGO Grade 2.   MYOMETRIAL INVASION: Present, depth of invasion 5/23 mm (22%).   UTERINE SEROSA INVOLVEMENT: Not identified.   CERVICAL STROMAL INVOLVEMENT: Not identified.   OTHER TISSUE/ORGAN INVOLVEMENT: Not identified.   MARGINS: Not involved: Greater than 1.5 cm distance.   PELVIC WASHINGS ([id number]): No malignant cells found.   LYMPHOVASCULAR INVASION: Not identified.   PATHOLOGIC STAGE CLASSIFICATION: pT1a, N0 (FIGO IA).   ANCILLARY STUDIES:   MMR IHC: See [facility] report [id number].   ER/PR Results:   Estrogen Receptor (ER) Status: Positive (95% of cells staining/strong intensity).   Progesterone Receptor (PgR) Status: Positive (60% of cells staining/moderate intensity).   B: LEFT PELVIC LYMPH NODES, DISSECTION: Not involved (7 NODES).   C: RIGHT PELVIC LYMPH NODES, DISSECTION: Not involved (8 NODES).     Washings, Pelvic   Satisfactory for evaluation.   No malignant cells found.     Medications:      Current Outpatient Medications on File Prior to Visit   Medication Sig Dispense Refill   ??? loratadine (Claritin) 10 mg tablet Take 10 mg by mouth.     ??? cyanocobalamin (VITAMIN B12) 1,000 mcg/mL injection      ??? ferrous sulfate 324 mg (65 mg iron) tablet Take  by mouth Daily (before breakfast).     ??? polyethylene glycol (MIRALAX) 17 gram/dose powder Take 17 g by mouth daily. 1 tablespoon with 8 oz of water daily 510 g 0   ??? cholecalciferol (VITAMIN D3) 1,000 unit tablet 1,000 Units.     ??? multivitamin (ONE A DAY) tablet Take 1 Tab by mouth daily.       No current facility-administered medications on file prior to visit.        Allergies:     Allergies   Allergen Reactions   ??? Aspirin Shortness of Breath   ??? Nsaids (Non-Steroidal Anti-Inflammatory Drug) Shortness of Breath   ??? Sulfa (Sulfonamide Antibiotics) Anaphylaxis   ??? Contrast Agent [Iodine] Rash   ??? Pcn [Penicillins] Hives   ??? Percocet [Oxycodone-Acetaminophen] Other (comments)     headaches       OBJECTIVE:    Vitals:   Visit Vitals  BP 130/81 (BP 1 Location: Left arm, BP Patient Position: Sitting)   Pulse (!) 109   Temp 98 ??F (36.7 ??C) (Temporal)   Ht '5\' 3"'  (1.6 m)   Wt 114.2 kg (251 lb 12.8 oz)   LMP  (LMP Unknown)   SpO2 99%   BMI 44.60 kg/m??  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  The operative procedures and clinical results previously reviewed with the patient. No adjuvant treatment needed.  Mixed urinary incontinence. Previously referred to urogyn for further evaluation and discussion of management options, encouraged her to f/u with urology   Reviewed signs and symptoms of recurrence and discussed surveillance interval.  Pelvic exam performed today  I will see the patient back in 6 months for routine surveillance. The patient is advised to call our office with any problems or concerns.    Total time spent was 25 minutes regarding diagnosis of endometrial cancer and >50% of this time was spent counseling and coordinating care.    Moss Mc DO  Gynecologic Oncology  05/16/2019/11:12 AM

## 2019-05-16 NOTE — Progress Notes (Signed)
Delaila Giarraputo is a 68 y.o. female presents in office for endometrial cancer    Chief Complaint   Patient presents with   ??? Follow-up        Do you have any unusual vaginal bleeding, discharge or irritation? No  Do you have any changes in your bowel movements? Pt c/o IBS  Have you been experiencing nausea or vomiting? No  Have you been experiencing any continuous or worsening abdominal pain? No  Any urinary burning? No    Visit Vitals  BP 130/81 (BP 1 Location: Left arm, BP Patient Position: Sitting)   Pulse (!) 109   Temp 98 ??F (36.7 ??C) (Temporal)   Ht 5\' 3"  (1.6 m)   Wt 251 lb 12.8 oz (114.2 kg)   SpO2 99%   BMI 44.60 kg/m??         1. Have you been to the ER, urgent care clinic since your last visit?  Hospitalized since your last visit? No    2. Have you seen or consulted any other health care providers outside of the [facility] since your last visit?  Include any pap smears or colon screening. No    3 most recent PHQ Screens 09/10/2018   Little interest or pleasure in doing things Not at all   Feeling down, depressed, irritable, or hopeless Several days   Total Score PHQ 2 1       Abuse Screening Questionnaire 12/22/2017   Do you ever feel afraid of your partner? N   Are you in a relationship with someone who physically or mentally threatens you? N   Is it safe for you to go home? Y       Fall Risk Assessment, last 12 mths 05/16/2019   Able to walk? No   Fall in past 12 months? -   Fall with injury? -   Number of falls in past 12 months -   Fall Risk Score -       Learning Assessment 05/09/2016   PRIMARY LEARNER Patient   HIGHEST LEVEL OF EDUCATION - PRIMARY LEARNER  SOME COLLEGE   BARRIERS PRIMARY LEARNER NONE   CO-LEARNER CAREGIVER No   PRIMARY LANGUAGE ENGLISH   LEARNER PREFERENCE PRIMARY DEMONSTRATION   ANSWERED BY patient   RELATIONSHIP SELF

## 2019-08-10 ENCOUNTER — Other Ambulatory Visit: Payer: Self-pay

## 2019-08-10 ENCOUNTER — Emergency Department (HOSPITAL_COMMUNITY)
Admission: EM | Admit: 2019-08-10 | Discharge: 2019-08-10 | Disposition: A | Discharge status: Home / Self Care | Payer: Medicaid - Out of State | Attending: Emergency Medicine | Admitting: Emergency Medicine

## 2019-08-10 ENCOUNTER — Encounter (HOSPITAL_COMMUNITY): Payer: Self-pay | Admitting: Emergency Medicine

## 2019-08-10 DIAGNOSIS — X509XXA Other and unspecified overexertion or strenuous movements or postures, initial encounter: Secondary | ICD-10-CM | POA: Insufficient documentation

## 2019-08-10 DIAGNOSIS — S161XXA Strain of muscle, fascia and tendon at neck level, initial encounter: Secondary | ICD-10-CM | POA: Diagnosis not present

## 2019-08-10 DIAGNOSIS — Y999 Unspecified external cause status: Secondary | ICD-10-CM | POA: Insufficient documentation

## 2019-08-10 DIAGNOSIS — I10 Essential (primary) hypertension: Secondary | ICD-10-CM | POA: Diagnosis present

## 2019-08-10 DIAGNOSIS — Y9289 Other specified places as the place of occurrence of the external cause: Secondary | ICD-10-CM | POA: Diagnosis not present

## 2019-08-10 DIAGNOSIS — Z87891 Personal history of nicotine dependence: Secondary | ICD-10-CM | POA: Insufficient documentation

## 2019-08-10 DIAGNOSIS — Y9389 Activity, other specified: Secondary | ICD-10-CM | POA: Diagnosis not present

## 2019-08-10 HISTORY — DX: Malignant (primary) neoplasm, unspecified: C80.1

## 2019-08-10 LAB — CBC WITH DIFFERENTIAL/PLATELET
Abs Immature Granulocytes: 0.02 10*3/uL (ref 0.00–0.07)
Basophils Absolute: 0.1 10*3/uL (ref 0.0–0.1)
Basophils Relative: 1 %
Eosinophils Absolute: 0.1 10*3/uL (ref 0.0–0.5)
Eosinophils Relative: 2 %
HCT: 39.1 % (ref 36.0–46.0)
Hemoglobin: 12.2 g/dL (ref 12.0–15.0)
Immature Granulocytes: 0 %
Lymphocytes Relative: 35 %
Lymphs Abs: 2.5 10*3/uL (ref 0.7–4.0)
MCH: 28.3 pg (ref 26.0–34.0)
MCHC: 31.2 g/dL (ref 30.0–36.0)
MCV: 90.7 fL (ref 80.0–100.0)
Monocytes Absolute: 0.7 10*3/uL (ref 0.1–1.0)
Monocytes Relative: 10 %
Neutro Abs: 3.7 10*3/uL (ref 1.7–7.7)
Neutrophils Relative %: 52 %
Platelets: 288 10*3/uL (ref 150–400)
RBC: 4.31 MIL/uL (ref 3.87–5.11)
RDW: 15.3 % (ref 11.5–15.5)
WBC: 7.2 10*3/uL (ref 4.0–10.5)
nRBC: 0 % (ref 0.0–0.2)

## 2019-08-10 LAB — BASIC METABOLIC PANEL
Anion gap: 11 (ref 5–15)
BUN: 11 mg/dL (ref 8–23)
CO2: 27 mmol/L (ref 22–32)
Calcium: 9.4 mg/dL (ref 8.9–10.3)
Chloride: 103 mmol/L (ref 98–111)
Creatinine, Ser: 0.76 mg/dL (ref 0.44–1.00)
GFR calc Af Amer: >60 mL/min (ref >60)
GFR calc non Af Amer: >60 mL/min (ref >60)
Glucose, Bld: 121 mg/dL — ABNORMAL HIGH (ref 70–99)
Potassium: 4 mmol/L (ref 3.5–5.1)
Sodium: 141 mmol/L (ref 135–145)

## 2019-08-10 MED ORDER — HYDROCODONE-ACETAMINOPHEN 5-325 MG PO TABS: 1.0000 | ORAL_TABLET | Freq: Four times a day (QID) | ORAL | 0 refills | Status: DC | PRN

## 2019-08-10 MED ORDER — HYDROCODONE-ACETAMINOPHEN 5-325 MG PO TABS
1.0000 | ORAL_TABLET | Freq: Once | ORAL | Status: AC
  Administered 2019-08-10: 04:00:00 1 via ORAL
  Filled 2019-08-10: qty 1

## 2019-08-10 MED ORDER — DEXAMETHASONE SODIUM PHOSPHATE 10 MG/ML IJ SOLN
10.0000 mg | Freq: Once | INTRAMUSCULAR | Status: AC
  Administered 2019-08-10: 10 mg via INTRAMUSCULAR
  Filled 2019-08-10: qty 1

## 2019-08-10 MED ORDER — DIAZEPAM 5 MG PO TABS
5.0000 mg | ORAL_TABLET | Freq: Once | ORAL | Status: AC
  Administered 2019-08-10: 5 mg via ORAL
  Filled 2019-08-10: qty 1

## 2019-08-10 NOTE — Discharge Instructions (Addendum)
You were seen today and likely have a strained muscle in your neck.  Apply heat.  Take Norco as needed.  Do not drive while taking Norco.  Use the exercises provided to stretch.  Avoid having lifting.  If you develop weakness, numbness, tingling in the upper extremity or any new or worsening symptoms you should be reevaluated.

## 2019-08-10 NOTE — ED Provider Notes (Signed)
[facility] Provider Note   CSN: [id number] Arrival date & time: 08/10/19  0101     History Chief Complaint  Patient presents with  . Hypertension    Tonya Soto is a 69 y.o. female.  HPI     This is a 69 year old female who presents with concerns for hypertension and neck pain.  Patient reports that she is in the process of moving.  She lifted something heavy on Sunday and strained her neck.  She states she has had significant pain in the left shoulder and neck.  It is worse with range of motion of the neck and certain movements.  She has taken Tylenol and Tylenol with codeine with some relief but continues to have 8 out of 10 pain.  Tonight she was having fairly significant pain.  She states that something told her to check her blood pressure and it was elevated into the 170s.  She denies any known history of hypertension but states that in the setting of pain she frequently does get high blood pressure.  She also states that she is very nervous and gets anxious in healthcare settings.  She denies any chest pain, shortness of breath, recent fevers, numbness or tingling of the arm.  Denies headache.  Past Medical History:  Diagnosis Date  . Cancer [facility])    UTERINE CANCER (2018)    There are no problems to display for this patient.   Past Surgical History:  Procedure Laterality Date  . ABDOMINAL HYSTERECTOMY    . KNEE SURGERY     LIGAMENTS (IN 20S)      OB History   No obstetric history on file.     History reviewed. No pertinent family history.  Social History   Tobacco Use  . Smoking status: Former Research scientist (life sciences)  . Smokeless tobacco: Never Used  Substance Use Topics  . Alcohol use: Not Currently  . Drug use: Not Currently    Home Medications Prior to Admission medications   Not on File    Allergies    Aspirin, Naproxen, Nsaids, Penicillins, Sulfa antibiotics, Contrast media [iodinated diagnostic agents],  Oxycodone-acetaminophen, and Iodine  Review of Systems   Review of Systems  Constitutional: Negative for fever.  Respiratory: Negative for shortness of breath.   Cardiovascular: Negative for chest pain.  Gastrointestinal: Negative for abdominal pain, nausea and vomiting.  Genitourinary: Negative for dysuria.  Musculoskeletal: Positive for neck pain.  All other systems reviewed and are negative.   Physical Exam Updated Vital Signs BP 135/81   Pulse 97   Temp 98.9 F (37.2 C) (Oral)   Resp (!) 0   Ht 1.6 m (5\' 3" )   Wt 108.9 kg   SpO2 97%   BMI 42.51 kg/m   Physical Exam Vitals and nursing note reviewed.  Constitutional:      Appearance: She is well-developed. She is obese. She is not ill-appearing.  HENT:     Head: Normocephalic and atraumatic.     Nose: Nose normal.     Mouth/Throat:     Mouth: Mucous membranes are moist.  Eyes:     Pupils: Pupils are equal, round, and reactive to light.  Neck:     Comments: Tenderness palpation over the left upper trapezius and shoulder musculature, spasm noted Cardiovascular:     Rate and Rhythm: Normal rate and regular rhythm.     Heart sounds: Normal heart sounds.  Pulmonary:     Effort: Pulmonary effort is normal. No respiratory distress.  Breath sounds: No wheezing.  Abdominal:     Palpations: Abdomen is soft.     Tenderness: There is no abdominal tenderness.  Musculoskeletal:     Cervical back: Normal range of motion.     Right lower leg: No edema.     Left lower leg: No edema.  Skin:    General: Skin is warm and dry.  Neurological:     Mental Status: She is alert and oriented to person, place, and time.     Comments: 5 out of 5 strength bilateral upper extremities  Psychiatric:        Mood and Affect: Mood normal.     ED Results / Procedures / Treatments   Labs (all labs ordered are listed, but only abnormal results are displayed) Labs Reviewed  BASIC METABOLIC PANEL - Abnormal; Notable for the following  components:      Result Value   Glucose, Bld 121 (*)    All other components within normal limits  CBC WITH DIFFERENTIAL/PLATELET    EKG EKG Interpretation  Date/Time:  Wednesday August 10 2019 01:22:27 EST Ventricular Rate:  128 PR Interval:    QRS Duration: 81 QT Interval:  316 QTC Calculation: 462 R Axis:   -26 Text Interpretation: Sinus tachycardia Inferior infarct, old No prior for comparison Confirmed by Thayer Jew [phone]) on 08/10/2019 2:31:21 AM   Radiology No results found.  Procedures Procedures (including critical care time)  Medications Ordered in ED Medications  diazepam (VALIUM) tablet 5 mg (5 mg Oral Given 08/10/19 0154)  dexamethasone (DECADRON) injection 10 mg (10 mg Intramuscular Given 08/10/19 0354)  HYDROcodone-acetaminophen (NORCO/VICODIN) 5-325 MG per tablet 1 tablet (1 tablet Oral Given 08/10/19 0354)    ED Course  I have reviewed the triage vital signs and the nursing notes.  Pertinent labs & imaging results that were available during my care of the patient were reviewed by me and considered in my medical decision making (see chart for details).  Clinical Course as of Aug 09 445  Wed Aug 10, 2019  0321 On recheck, patient is calm.  Heart rate and blood pressure have normalized.  She reports persistent left neck pain not improved with Valium.  She does state that the Valium helped her anxiety.  She reports allergies to all NSAIDs.  Suspect she would benefit from antiinflammation.  Will give IM Decadron and Norco.  Will reassess.   [CH]    Clinical Course User Index [CH] Desirey Keahey, Barbette Hair, MD   MDM Rules/Calculators/A&P                       Patient presents with left-sided neck pain as well as hypertension.  She is overall nontoxic and vital signs are notable for mild hypertension.  She has no signs or symptoms of hypertensive urgency or emergency.  She reports that she often becomes tachycardic and hypertensive in the setting of pain.   She clinically appears uncomfortable but is neurologically intact.  Basic lab work obtained.  Patient was initially given Valium for muscle spasm and anxiety.  See clinical course above.  She was subsequently given Decadron and Norco.  On recheck at 4:30 AM, patient reports improvement of her symptoms.  I discussed with her that Decadron would not likely take effect for another several hours.  Will discharge with a short course of Norco.  She was advised not to drive while taking Norco.  After history, exam, and medical workup I feel the patient has  been appropriately medically screened and is safe for discharge home. Pertinent diagnoses were discussed with the patient. Patient was given return precautions.   Final Clinical Impression(s) / ED Diagnoses Final diagnoses:  Neck strain, initial encounter    Rx / DC Orders ED Discharge Orders    None       Dona Walby, Barbette Hair, MD 08/10/19 [phone]

## 2019-08-10 NOTE — ED Triage Notes (Signed)
Pt arrives to ED w/complaints of hypertension (171/111). Pt not on antihypertensive meds. Pt also complains of neck stiffness/pain, states she has been moving & pulled something in her neck. A&O x4, NAD.

## 2019-11-24 NOTE — Telephone Encounter (Signed)
LVM returning patients call about follow up appointment.; office number was left on voicemail.

## 2019-11-25 ENCOUNTER — Encounter

## 2019-12-07 ENCOUNTER — Ambulatory Visit: Admit: 2019-12-07 | Discharge: 2019-12-07 | Payer: MEDICAID | Attending: Gynecologic Oncology | Primary: Family Medicine

## 2019-12-07 DIAGNOSIS — C541 Malignant neoplasm of endometrium: Secondary | ICD-10-CM

## 2019-12-08 ENCOUNTER — Encounter

## 2019-12-08 ENCOUNTER — Inpatient Hospital Stay: Admit: 2019-12-08 | Payer: MEDICAID | Attending: Family Medicine | Primary: Family Medicine

## 2019-12-08 DIAGNOSIS — N644 Mastodynia: Secondary | ICD-10-CM

## 2019-12-22 ENCOUNTER — Encounter

## 2020-01-23 ENCOUNTER — Inpatient Hospital Stay: Payer: MEDICAID | Attending: Family Medicine | Primary: Family Medicine

## 2020-06-06 ENCOUNTER — Encounter: Attending: Gynecologic Oncology | Primary: Family Medicine

## 2020-07-17 ENCOUNTER — Encounter: Attending: Gynecologic Oncology | Primary: Family Medicine

## 2020-08-07 ENCOUNTER — Ambulatory Visit: Admit: 2020-08-07 | Discharge: 2020-08-07 | Payer: MEDICAID | Attending: Gynecologic Oncology | Primary: Family Medicine

## 2020-08-07 DIAGNOSIS — C541 Malignant neoplasm of endometrium: Secondary | ICD-10-CM

## 2021-02-05 ENCOUNTER — Encounter: Attending: Gynecologic Oncology | Primary: Family Medicine

## 2021-03-18 ENCOUNTER — Encounter

## 2021-03-30 ENCOUNTER — Encounter (HOSPITAL_COMMUNITY): Payer: Self-pay

## 2021-03-30 ENCOUNTER — Emergency Department (HOSPITAL_COMMUNITY): Payer: Medicaid - Out of State

## 2021-03-30 ENCOUNTER — Other Ambulatory Visit: Payer: Self-pay

## 2021-03-30 ENCOUNTER — Emergency Department (HOSPITAL_COMMUNITY)
Admission: EM | Admit: 2021-03-30 | Discharge: 2021-03-31 | Disposition: A | Discharge status: Home / Self Care | Payer: Medicaid - Out of State | Attending: Emergency Medicine | Admitting: Emergency Medicine

## 2021-03-30 DIAGNOSIS — Z8616 Personal history of COVID-19: Secondary | ICD-10-CM | POA: Insufficient documentation

## 2021-03-30 DIAGNOSIS — R079 Chest pain, unspecified: Secondary | ICD-10-CM

## 2021-03-30 DIAGNOSIS — F419 Anxiety disorder, unspecified: Secondary | ICD-10-CM | POA: Diagnosis not present

## 2021-03-30 DIAGNOSIS — Z8542 Personal history of malignant neoplasm of other parts of uterus: Secondary | ICD-10-CM | POA: Insufficient documentation

## 2021-03-30 DIAGNOSIS — R000 Tachycardia, unspecified: Secondary | ICD-10-CM | POA: Diagnosis not present

## 2021-03-30 DIAGNOSIS — Z87891 Personal history of nicotine dependence: Secondary | ICD-10-CM | POA: Diagnosis not present

## 2021-03-30 DIAGNOSIS — R0789 Other chest pain: Secondary | ICD-10-CM | POA: Insufficient documentation

## 2021-03-30 HISTORY — DX: Anxiety disorder, unspecified: F41.9

## 2021-03-30 HISTORY — DX: Asperger's syndrome: F84.5

## 2021-03-30 HISTORY — DX: COVID-19: U07.1

## 2021-03-30 NOTE — ED Triage Notes (Signed)
Pt arrived via POV c/o CP that began this morning. Pt reports Pain in unrelieved by anything and is seeing a Film/video editor and Pulmonologist following her last Covid Infection in June 2022.

## 2021-03-31 LAB — BASIC METABOLIC PANEL
Anion gap: 8 (ref 5–15)
BUN: 13 mg/dL (ref 8–23)
CO2: 26 mmol/L (ref 22–32)
Calcium: 9.7 mg/dL (ref 8.9–10.3)
Chloride: 105 mmol/L (ref 98–111)
Creatinine, Ser: 0.8 mg/dL (ref 0.44–1.00)
GFR, Estimated: >60 mL/min (ref >60)
Glucose, Bld: 104 mg/dL — ABNORMAL HIGH (ref 70–99)
Potassium: 3.5 mmol/L (ref 3.5–5.1)
Sodium: 139 mmol/L (ref 135–145)

## 2021-03-31 LAB — CBC
HCT: 40.2 % (ref 36.0–46.0)
Hemoglobin: 12.7 g/dL (ref 12.0–15.0)
MCH: 28.8 pg (ref 26.0–34.0)
MCHC: 31.6 g/dL (ref 30.0–36.0)
MCV: 91.2 fL (ref 80.0–100.0)
Platelets: 289 10*3/uL (ref 150–400)
RBC: 4.41 MIL/uL (ref 3.87–5.11)
RDW: 15 % (ref 11.5–15.5)
WBC: 6.1 10*3/uL (ref 4.0–10.5)
nRBC: 0 % (ref 0.0–0.2)

## 2021-03-31 LAB — TROPONIN I (HIGH SENSITIVITY)
Troponin I (High Sensitivity): 3 ng/L (ref <18)
Troponin I (High Sensitivity): 4 ng/L (ref <18)

## 2021-03-31 MED ORDER — SODIUM CHLORIDE 0.9 % IV BOLUS
1000.0000 mL | Freq: Once | INTRAVENOUS | Status: AC
  Administered 2021-03-31: 1000 mL via INTRAVENOUS

## 2021-03-31 NOTE — ED Provider Notes (Signed)
[facility] Emergency Department Provider Note MRN:  [id number]  Arrival date & time: 03/31/21     Chief Complaint   Chest Pain   History of Present Illness   Tonya Soto is a 70 y.o. year-old female with a history of anxiety, Asperger syndrome presenting to the ED with chief complaint of chest pain.  Location: Central chest Duration: 1 or 2 days Onset: Gradual Timing: Intermittent Description: Pressure Severity: Mild Exacerbating/Alleviating Factors: None Associated Symptoms: Heart racing Pertinent Negatives: No shortness of breath, no fever or cough, no abdominal pain, no leg pain or swelling, no numbness or weakness to the arms or legs  Additional History: Patient took Mucinex and then noticed that her heart was racing.  Explains that this is happened in the past with Mucinex.  Also feels anxious.  Review of Systems  A complete 10 system review of systems was obtained and all systems are negative except as noted in the HPI and PMH.   Patient's Health History    Past Medical History:  Diagnosis Date   Anxiety    Asperger syndrome    Cancer [facility])    UTERINE CANCER (2018)   COVID-19     Past Surgical History:  Procedure Laterality Date   ABDOMINAL HYSTERECTOMY     KNEE SURGERY     LIGAMENTS (IN 20S)     History reviewed. No pertinent family history.  Social History   Socioeconomic History   Marital status: Divorced    Spouse name: Not on file   Number of children: Not on file   Years of education: Not on file   Highest education level: Not on file  Occupational History   Not on file  Tobacco Use   Smoking status: Former   Smokeless tobacco: Never  Vaping Use   Vaping Use: Never used  Substance and Sexual Activity   Alcohol use: Not Currently   Drug use: Not Currently   Sexual activity: Not Currently  Other Topics Concern   Not on file  Social History Narrative   Not on file   Social Determinants of Health    Financial Resource Strain: Not on file  Food Insecurity: Not on file  Transportation Needs: Not on file  Physical Activity: Not on file  Stress: Not on file  Social Connections: Not on file  Intimate Partner Violence: Not on file     Physical Exam   Vitals:   03/31/21 0200 03/31/21 0230  BP: 134/88 (!) 144/102  Pulse: (!) 107 (!) 111  Resp: 15 (!) 31  Temp:    SpO2: 97% 99%    CONSTITUTIONAL: Well-appearing, NAD NEURO:  Alert and oriented x 3, no focal deficits EYES:  eyes equal and reactive ENT/NECK:  no LAD, no JVD CARDIO: Regular rate, well-perfused, normal S1 and S2 PULM:  CTAB no wheezing or rhonchi GI/GU:  normal bowel sounds, non-distended, non-tender MSK/SPINE:  No gross deformities, no edema SKIN:  no rash, atraumatic PSYCH:  Appropriate speech and behavior  *Additional and/or pertinent findings included in MDM below  Diagnostic and Interventional Summary    EKG Interpretation  Date/Time:  Saturday March 30 2021 22:52:53 EDT Ventricular Rate:  136 PR Interval:  128 QRS Duration: 64 QT Interval:  350 QTC Calculation: 526 R Axis:   -10 Text Interpretation: Sinus tachycardia Low voltage QRS Nonspecific T wave abnormality Abnormal ECG Confirmed by Gerlene Fee [phone]) on 03/31/2021 1:43:41 AM       Labs Reviewed  BASIC METABOLIC PANEL -  Abnormal; Notable for the following components:      Result Value   Glucose, Bld 104 (*)    All other components within normal limits  CBC  TROPONIN I (HIGH SENSITIVITY)  TROPONIN I (HIGH SENSITIVITY)    DG Chest Port 1 View  Final Result      Medications  sodium chloride 0.9 % bolus 1,000 mL (1,000 mLs Intravenous New Bag/Given 03/31/21 0158)     Procedures  /  Critical Care Procedures  ED Course and Medical Decision Making  I have reviewed the triage vital signs, the nursing notes, and pertinent available records from the EMR.  Listed above are laboratory and imaging tests that I personally ordered,  reviewed, and interpreted and then considered in my medical decision making (see below for details).  Patient is tachycardic up to 130, EKG demonstrating sinus tachycardia with a prolonged QTC.  Otherwise no ectopy.  Patient has no shortness of breath, no leg pain or swelling.  Doubt PE.  She denies any chest pain, has been having some intermittent chest pressure but none at this time.  First troponin is negative.  Providing IV fluids and will reassess heart rate.  Could be dehydration in the setting of viral illness given the patient has been taking Mucinex.     Patient's tachycardia is resolved after liter of fluids, second troponin is negative.  Sitting comfortably no acute distress.  Nothing to suggest emergent process, patient is appropriate for discharge.  Barth Kirks. [place]onia Small, [place] [email or web address]  Final Clinical Impressions(s) / ED Diagnoses     ICD-10-CM   1. Tachycardia  R00.0     2. Chest pain, unspecified type  R07.9       ED Discharge Orders     None        Discharge Instructions Discussed with and Provided to Patient:     Discharge Instructions      You were evaluated in the Emergency Department and after careful evaluation, we did not find any emergent condition requiring admission or further testing in the hospital.  Your exam/testing today was overall reassuring.  Recommend continued follow-up with your primary   Please return to the Emergency Department if you experience any worsening of your condition.  Thank you for allowing [place] to be a part of your care.         Maudie Flakes, MD 03/31/21 [phone]

## 2021-03-31 NOTE — Discharge Instructions (Addendum)
You were evaluated in the Emergency Department and after careful evaluation, we did not find any emergent condition requiring admission or further testing in the hospital.  Your exam/testing today was overall reassuring.  Recommend continued follow-up with your primary   Please return to the Emergency Department if you experience any worsening of your condition.  Thank you for allowing [place] to be a part of your care.

## 2021-06-20 ENCOUNTER — Inpatient Hospital Stay: Payer: MEDICAID | Attending: Family Medicine | Primary: Family Medicine

## 2021-11-09 ENCOUNTER — Other Ambulatory Visit: Payer: Self-pay

## 2021-11-09 ENCOUNTER — Encounter (HOSPITAL_COMMUNITY): Payer: Self-pay

## 2021-11-09 DIAGNOSIS — J020 Streptococcal pharyngitis: Secondary | ICD-10-CM | POA: Insufficient documentation

## 2021-11-09 DIAGNOSIS — Z8616 Personal history of COVID-19: Secondary | ICD-10-CM | POA: Diagnosis not present

## 2021-11-09 DIAGNOSIS — J029 Acute pharyngitis, unspecified: Secondary | ICD-10-CM | POA: Diagnosis present

## 2021-11-09 NOTE — ED Triage Notes (Addendum)
Sore throat for a couple days. Other family members have strep.  ?Needs paper script.  ?

## 2021-11-10 ENCOUNTER — Emergency Department (HOSPITAL_COMMUNITY)
Admission: EM | Admit: 2021-11-10 | Discharge: 2021-11-10 | Disposition: A | Discharge status: Home / Self Care | Payer: Medicaid - Out of State | Attending: Emergency Medicine | Admitting: Emergency Medicine

## 2021-11-10 DIAGNOSIS — J020 Streptococcal pharyngitis: Secondary | ICD-10-CM

## 2021-11-10 MED ORDER — AZITHROMYCIN 250 MG PO TABS
500.0000 mg | ORAL_TABLET | Freq: Once | ORAL | Status: AC
  Administered 2021-11-10: 500 mg via ORAL
  Filled 2021-11-10: qty 2

## 2021-11-10 MED ORDER — ACETAMINOPHEN 500 MG PO TABS
1000.0000 mg | ORAL_TABLET | Freq: Once | ORAL | Status: AC
  Administered 2021-11-10: 1000 mg via ORAL
  Filled 2021-11-10: qty 2

## 2021-11-10 MED ORDER — AZITHROMYCIN 250 MG PO TABS: 250.0000 mg | ORAL_TABLET | Freq: Every day | ORAL | 0 refills | Status: DC

## 2021-11-10 NOTE — Discharge Instructions (Addendum)
Begin taking Zithromax as prescribed. ? ?Follow-up with primary doctor if not improving in the next few days. ?

## 2021-11-10 NOTE — ED Provider Notes (Signed)
?[facility] ?Provider Note ? ? ?CSN: [id number] ?Arrival date & time: 11/09/21  2025 ? ?  ? ?History ? ?Chief Complaint  ?Patient presents with  ? Sore Throat  ?  strep  ? ? ?Tonya Soto is a 71 y.o. female. ? 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  Patient presenting with complaints of sore throat.  She is exposed to her granddaughter and other family members who have been since diagnosed with strep throat.  She denies productive cough.  She denies any difficulty breathing.  Pain is worse with swallowing with no alleviating factors. ? ?The history is provided by the patient.  ? ?  ? ?Home Medications ?Prior to Admission medications   ?Medication Sig Start Date End Date Taking? Authorizing Provider  ?HYDROcodone-acetaminophen (NORCO/VICODIN) 5-325 MG tablet Take 1 tablet by mouth every 6 (six) hours as needed. 08/10/19   Horton, Barbette Hair, MD  ?   ? ?Allergies    ?Aspirin, Naproxen, Nsaids, Penicillins, Sulfa antibiotics, Contrast media [iodinated contrast media], Oxycodone-acetaminophen, and Iodine   ? ?Review of Systems   ?Review of Systems  ?All other systems reviewed and are negative. ? ?Physical Exam ?Updated Vital Signs ?BP (!) 136/97 (BP Location: Right Arm)   Pulse (!) 108   Temp 98.7 ?F (37.1 ?C) (Oral)   Resp 20   Ht '5\' 3"'$  (1.6 m)   Wt 113.4 kg   SpO2 95%   BMI 44.29 kg/m?  ?Physical Exam ?Vitals and nursing note reviewed.  ?Constitutional:   ?   General: She is not in acute distress. ?   Appearance: She is well-developed. She is not diaphoretic.  ?HENT:  ?   Head: Normocephalic and atraumatic.  ?   Mouth/Throat:  ?   Pharynx: Posterior oropharyngeal erythema present.  ?   Tonsils: No tonsillar exudate or tonsillar abscesses.  ?Cardiovascular:  ?   Rate and Rhythm: Normal rate and regular rhythm.  ?   Heart sounds: No murmur heard. ?  No friction rub. No gallop.  ?Pulmonary:  ?   Effort: Pulmonary effort is  normal. No respiratory distress.  ?   Breath sounds: Normal breath sounds. No wheezing.  ?Abdominal:  ?   General: Bowel sounds are normal. There is no distension.  ?   Palpations: Abdomen is soft.  ?   Tenderness: There is no abdominal tenderness.  ?Musculoskeletal:     ?   General: Normal range of motion.  ?   Cervical back: Normal range of motion and neck supple.  ?Skin: ?   General: Skin is warm and dry.  ?Neurological:  ?   General: No focal deficit present.  ?   Mental Status: She is alert and oriented to person, place, and time.  ? ? ?ED Results / Procedures / Treatments   ?Labs ?(all labs ordered are listed, but only abnormal results are displayed) ?Labs Reviewed - No data to display ? ?EKG ?None ? ?Radiology ?No results found. ? ?Procedures ?Procedures  ? ? ?Medications Ordered in ED ?Medications  ?azithromycin (ZITHROMAX) tablet 500 mg (has no administration in time range)  ? ? ?ED Course/ Medical Decision Making/ A&P ? ?We will treat for strep throat given sick contacts.  Patient with multiple allergies and will be given Zithromax. ? ?Final Clinical Impression(s) / ED Diagnoses ?Final diagnoses:  ?None  ? ? ?Rx / DC Orders ?ED Discharge Orders   ? ? None  ? ?  ? ? ?  ?  Veryl Speak, MD ?11/10/21 0057 ? ?

## 2023-04-12 IMAGING — DX DG CHEST 1V PORT
1 series · 1 of 1 positions shown · non-contrast
Comparison: None.

CLINICAL DATA: Chest pain

EXAM:
PORTABLE CHEST 1 VIEW

[chest ap grid]
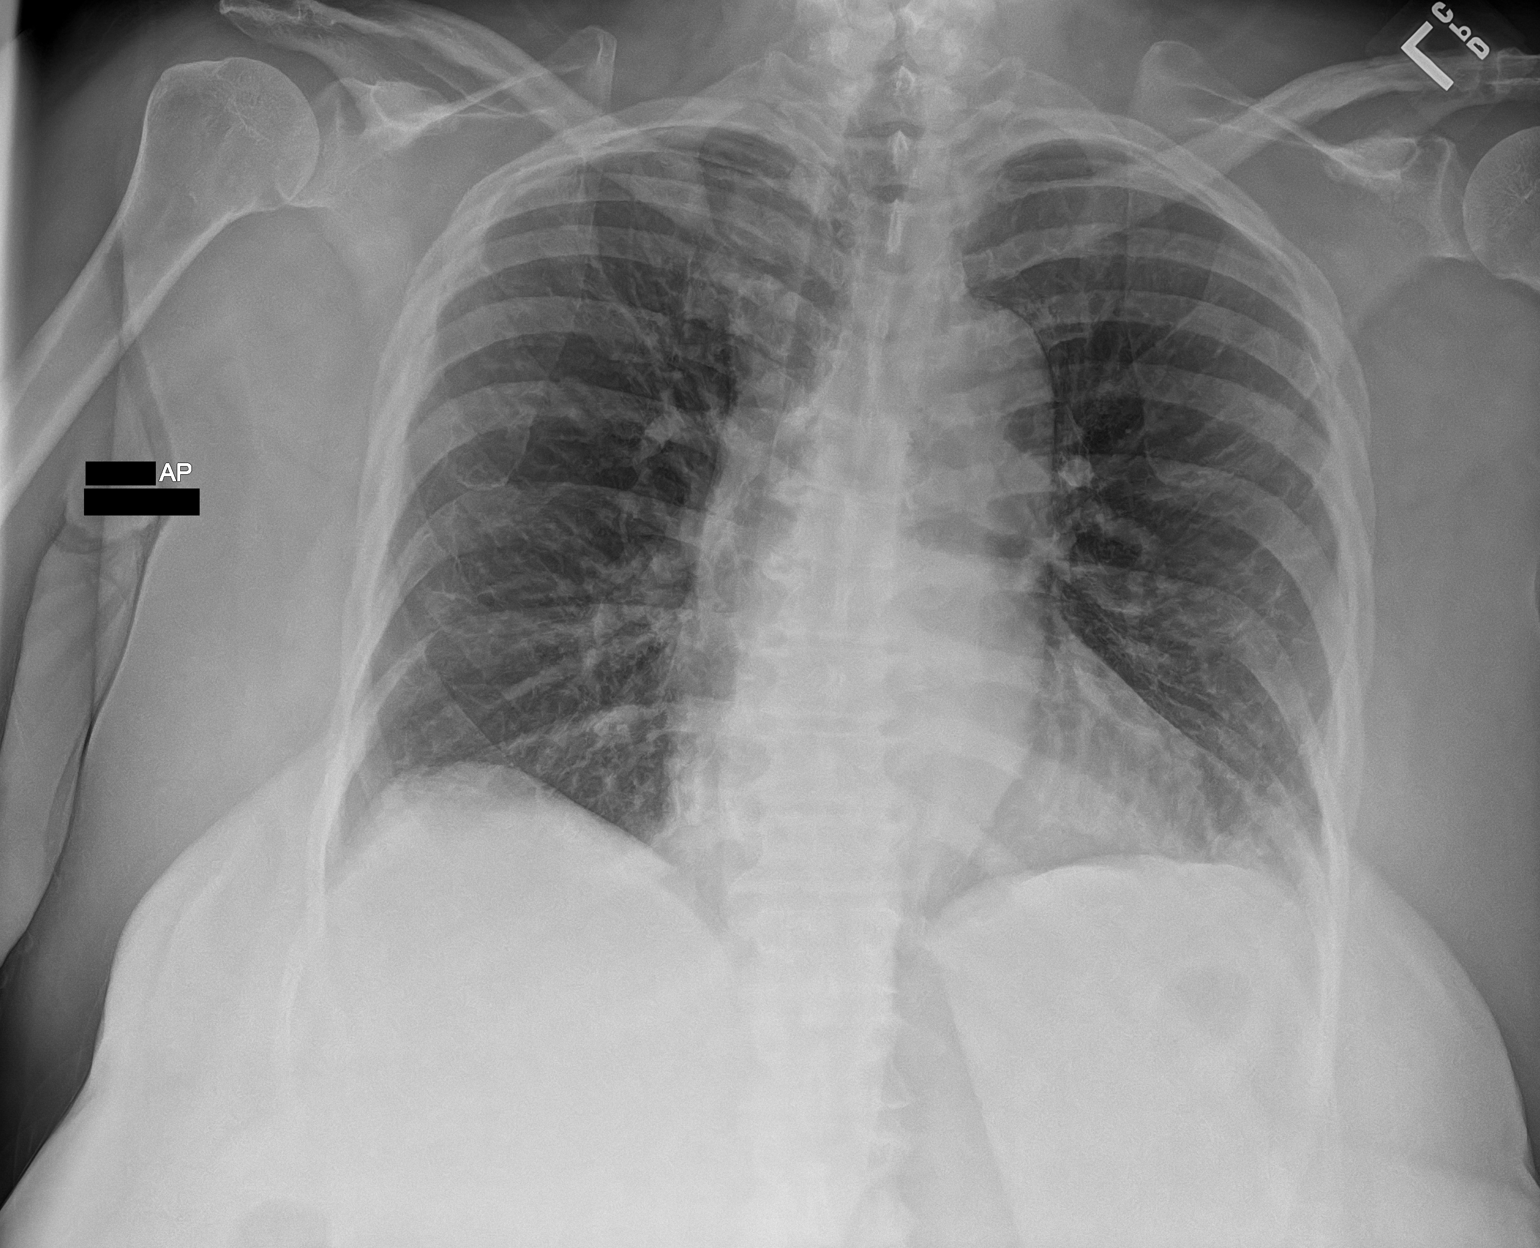

[1 of 1 positions shown; findings below may reference images not displayed]

FINDINGS: Cardiac shadow is within normal limits. Lungs are well aerated
bilaterally. Minimal left basilar atelectasis is seen. No bony
abnormality is noted.
IMPRESSION: Mild left basilar atelectasis.

## 2023-10-14 ENCOUNTER — Other Ambulatory Visit: Payer: Self-pay

## 2023-10-14 ENCOUNTER — Emergency Department (HOSPITAL_COMMUNITY)
Admission: EM | Admit: 2023-10-14 | Discharge: 2023-10-14 | Disposition: A | Discharge status: Home / Self Care | Attending: Emergency Medicine | Admitting: Emergency Medicine

## 2023-10-14 ENCOUNTER — Encounter (HOSPITAL_COMMUNITY): Payer: Self-pay

## 2023-10-14 ENCOUNTER — Emergency Department (HOSPITAL_COMMUNITY)

## 2023-10-14 DIAGNOSIS — R000 Tachycardia, unspecified: Secondary | ICD-10-CM | POA: Insufficient documentation

## 2023-10-14 DIAGNOSIS — R002 Palpitations: Secondary | ICD-10-CM | POA: Insufficient documentation

## 2023-10-14 DIAGNOSIS — R0602 Shortness of breath: Secondary | ICD-10-CM | POA: Diagnosis not present

## 2023-10-14 DIAGNOSIS — R059 Cough, unspecified: Secondary | ICD-10-CM | POA: Diagnosis present

## 2023-10-14 DIAGNOSIS — R053 Chronic cough: Secondary | ICD-10-CM | POA: Diagnosis not present

## 2023-10-14 DIAGNOSIS — E119 Type 2 diabetes mellitus without complications: Secondary | ICD-10-CM | POA: Diagnosis not present

## 2023-10-14 LAB — COMPREHENSIVE METABOLIC PANEL WITH GFR
ALT: 24 U/L (ref 0–44)
AST: 23 U/L (ref 15–41)
Albumin: 3.6 g/dL (ref 3.5–5.0)
Alkaline Phosphatase: 94 U/L (ref 38–126)
Anion gap: 12 (ref 5–15)
BUN: 14 mg/dL (ref 8–23)
CO2: 25 mmol/L (ref 22–32)
Calcium: 9.6 mg/dL (ref 8.9–10.3)
Chloride: 102 mmol/L (ref 98–111)
Creatinine, Ser: 0.91 mg/dL (ref 0.44–1.00)
GFR, Estimated: >60 mL/min (ref >60)
Glucose, Bld: 126 mg/dL — ABNORMAL HIGH (ref 70–99)
Potassium: 3.9 mmol/L (ref 3.5–5.1)
Sodium: 139 mmol/L (ref 135–145)
Total Bilirubin: 0.4 mg/dL (ref 0.0–1.2)
Total Protein: 7.9 g/dL (ref 6.5–8.1)

## 2023-10-14 LAB — RESP PANEL BY RT-PCR (RSV, FLU A&B, COVID)  RVPGX2
Influenza A by PCR: NEGATIVE
Influenza B by PCR: NEGATIVE
Resp Syncytial Virus by PCR: NEGATIVE
SARS Coronavirus 2 by RT PCR: NEGATIVE

## 2023-10-14 LAB — CBC
HCT: 38.9 % (ref 36.0–46.0)
Hemoglobin: 12.5 g/dL (ref 12.0–15.0)
MCH: 28.5 pg (ref 26.0–34.0)
MCHC: 32.1 g/dL (ref 30.0–36.0)
MCV: 88.8 fL (ref 80.0–100.0)
Platelets: 293 10*3/uL (ref 150–400)
RBC: 4.38 MIL/uL (ref 3.87–5.11)
RDW: 15.9 % — ABNORMAL HIGH (ref 11.5–15.5)
WBC: 6.7 10*3/uL (ref 4.0–10.5)
nRBC: 0 % (ref 0.0–0.2)

## 2023-10-14 LAB — TROPONIN I (HIGH SENSITIVITY): Troponin I (High Sensitivity): 3 ng/L (ref <18)

## 2023-10-14 LAB — D-DIMER, QUANTITATIVE: D-Dimer, Quant: 0.68 ug{FEU}/mL — ABNORMAL HIGH (ref 0.00–0.50)

## 2023-10-14 LAB — BRAIN NATRIURETIC PEPTIDE: B Natriuretic Peptide: 10 pg/mL (ref 0.0–100.0)

## 2023-10-14 MED ORDER — DIAZEPAM 2 MG PO TABS
2.0000 mg | ORAL_TABLET | Freq: Once | ORAL | Status: AC
  Administered 2023-10-14: 2 mg via ORAL
  Filled 2023-10-14: qty 1

## 2023-10-14 MED ORDER — METOPROLOL SUCCINATE ER 25 MG PO TB24: 12.5000 mg | ORAL_TABLET | Freq: Every day | ORAL | 0 refills | Status: AC

## 2023-10-14 NOTE — ED Provider Notes (Signed)
 [facility] EMERGENCY DEPARTMENT AT [facility] Provider Note   CSN: [id number] Arrival date & time: 10/14/23  8295     History  Chief Complaint  Patient presents with   Cough   Tachycardia    Tonya Soto is a 73 y.o. female.  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  Patient reports that she has a chronic cough that is productive.  Says that last night she noticed that she was short of breath especially with exertion.  Also noticed that her heart rate started to become elevated this morning into the 140s.  Denies any chest pain.  Denies any leg swelling.  No active cancer, history of DVT or PE, history of MI, surgery in the past month, recent travel.  Denies any fevers recently.       Home Medications Prior to Admission medications   Medication Sig Start Date End Date Taking? Authorizing Provider  acetaminophen  (TYLENOL ) 650 MG CR tablet Take 1,300 mg by mouth 2 (two) times daily as needed. 08/04/23  Yes [provider]  albuterol (VENTOLIN HFA) 108 (90 Base) MCG/ACT inhaler Inhale into the lungs. 10/03/22  Yes [provider]  diazepam  (VALIUM ) 5 MG tablet Take by mouth.   Yes [provider]  Emollient (CERAVE MOISTURIZING) CREA Apply topically daily. 08/04/23  Yes [provider]  ferrous sulfate 325 (65 FE) MG tablet Take by mouth.   Yes [provider]  loratadine (CLARITIN) 10 MG tablet Take by mouth. 10/09/20  Yes [provider]  metoprolol  succinate (TOPROL -XL) 25 MG 24 hr tablet Take 0.5 tablets (12.5 mg total) by mouth daily. 10/14/23 11/13/23 Yes Ninetta Basket, MD  Vitamin D, Ergocalciferol, (DRISDOL) 1.25 MG (50000 UNIT) CAPS capsule Take 50,000 Units by mouth once a week. 10/05/23  Yes [provider]       Allergies    Aspirin, Contrast media [iodinated contrast media], Iodine, Naproxen, Nsaids, Penicillins, Sulfa antibiotics, and Oxycodone-acetaminophen     Review of Systems   Review of Systems  Physical Exam Updated Vital Signs BP 109/76   Pulse (!) 102   Temp 98 F (36.7 C) (Oral)   Resp (!) 26   Ht 5\' 3"  (1.6 m)   Wt 104.3 kg   SpO2 94%   BMI 40.74 kg/m  Physical Exam Vitals and nursing note reviewed.  Constitutional:      General: She is not in acute distress.    Appearance: She is well-developed.  HENT:     Head: Normocephalic and atraumatic.     Right Ear: External ear normal.     Left Ear: External ear normal.     Nose: Nose normal.  Eyes:     Extraocular Movements: Extraocular movements intact.     Conjunctiva/sclera: Conjunctivae normal.     Pupils: Pupils are equal, round, and reactive to light.  Cardiovascular:     Rate and Rhythm: Regular rhythm. Tachycardia present.     Heart sounds: No murmur heard. Pulmonary:     Effort: Pulmonary effort is normal. No respiratory distress.     Breath sounds: Normal breath sounds.  Musculoskeletal:     Cervical back: Normal range of motion and neck supple.     Right lower leg: Edema present.     Left lower leg: Edema present.  Skin:    General: Skin is warm and dry.  Neurological:     Mental Status: She is alert and oriented to person, place, and time. Mental status is at baseline.  Psychiatric:        Mood and Affect: Mood normal.     ED Results / Procedures / Treatments   Labs (all labs ordered are listed, but only abnormal results are displayed) Labs Reviewed  COMPREHENSIVE METABOLIC PANEL WITH GFR - Abnormal; Notable for the following components:      Result Value   Glucose, Bld 126 (*)    All other components within normal limits  CBC - Abnormal; Notable for the following components:   RDW 15.9 (*)    All other components within normal limits  D-DIMER, QUANTITATIVE (NOT AT [facility]) - Abnormal; Notable  for the following components:   D-Dimer, Quant 0.68 (*)    All other components within normal limits  RESP PANEL BY RT-PCR (RSV, FLU A&B, COVID)  [id number]  BRAIN NATRIURETIC PEPTIDE  TROPONIN I (HIGH SENSITIVITY)    EKG EKG Interpretation Date/Time:  Wednesday October 14 2023 08:29:47 EDT Ventricular Rate:  119 PR Interval:  164 QRS Duration:  68 QT Interval:  338 QTC Calculation: 476 R Axis:   4  Text Interpretation: Sinus tachycardia Low voltage, precordial leads Confirmed by Shyrl Doyne [phone]) on 10/14/2023 8:34:43 AM  Radiology DG Chest 2 View Result Date: 10/14/2023 CLINICAL DATA:  Short of breath, cough, tachycardia EXAM: CHEST - 2 VIEW COMPARISON:  03/30/2021 FINDINGS: Frontal and lateral views of the chest demonstrate mild enlargement of the cardiac silhouette. Lung volumes are diminished, with crowding of the pulmonary vasculature. No airspace disease, effusion, or pneumothorax. No acute bony abnormalities. IMPRESSION: 1. Low lung volumes.  No acute airspace disease. Electronically Signed   By: Bobbye Burrow M.D.   On: 10/14/2023 09:07    Procedures Procedures    Medications Ordered in ED Medications  diazepam  (VALIUM ) tablet 2 mg (2 mg Oral Given 10/14/23 1045)    ED Course/ Medical Decision Making/ A&P                                 Medical Decision Making Amount and/or Complexity of Data Reviewed Labs: ordered. Radiology: ordered.  Risk Prescription drug management.   Tonya Soto is a 73 y.o. female with comorbidities that complicate the patient evaluation including tachycardia (previously on metoprolol ), uterine cancer in remission, diabetes, and anxiety who presents to the emergency department with cough, shortness of breath, and tachycardia.   Initial Ddx:  Tachycardia, arrhythmia, MI, PE, CHF, URI, pneumonia  MDM/Course:  Patient presents to the emergency department with shortness of breath and elevated heart rate.  When she  arrived she was still tachycardic.  Appears to be a sinus tachycardia.  On exam does have some lower extremity edema but no abnormal lung sounds.  This is somewhat limited due to her habitus.  She is moderate risk for PE by Geneva score.  Had a D-dimer that was within age-adjusted limits so feel that PE is less likely.  Was feeling anxious and was given some Valium  and upon re-evaluation heart rate had improved.  BNP was 10.  Troponin was 3 making MI less likely.  Chest x-ray without pneumonia or significant pulmonary edema.  Suspect that she may be having sinus tachycardia either due to anxiety but does have a history of persistent tachycardia in the past for which she was on metoprolol .  Will resume her metoprolol   and have her follow-up with cardiology as an outpatient.  This patient presents to the ED for concern of complaints listed in HPI, this involves an extensive number of treatment options, and is a complaint that carries with it a high risk of complications and morbidity. Disposition including potential need for admission considered.   Dispo: DC Home. Return precautions discussed including, but not limited to, those listed in the AVS. Allowed pt time to ask questions which were answered fully prior to dc.  Records reviewed Outpatient Clinic Notes The following labs were independently interpreted: Chemistry and show no acute abnormality I independently reviewed the following imaging with scope of interpretation limited to determining acute life threatening conditions related to emergency care: Chest x-ray and agree with the radiologist interpretation with the following exceptions: none I personally reviewed and interpreted cardiac monitoring: sinus tachycardia I personally reviewed and interpreted the pt's EKG: see above for interpretation  I have reviewed the patients home medications and made adjustments as needed Social Determinants of health:  Geriatric  Portions of this note were  generated with Scientist, clinical (histocompatibility and immunogenetics). Dictation errors may occur despite best attempts at proofreading.     Final Clinical Impression(s) / ED Diagnoses Final diagnoses:  Tachycardia  Chronic cough  Shortness of breath  Palpitations    Rx / DC Orders ED Discharge Orders          Ordered    metoprolol  succinate (TOPROL -XL) 25 MG 24 hr tablet  Daily        10/14/23 1036              Ninetta Basket, MD 10/16/23 [phone]

## 2023-10-14 NOTE — Discharge Instructions (Signed)
 You were seen for your palpitations in the emergency department.   At home, please take the metoprolol for your palpitations.    Check your MyChart online for the results of any tests that had not resulted by the time you left the emergency department.   Follow-up with your primary doctor in 2-3 days regarding your visit.  Follow-up with your cardiologist this week.   Return immediately to the emergency department if you experience any of the following: chest pain, difficulty breathing, or any other concerning symptoms.    Thank you for visiting our Emergency Department. It was a pleasure taking care of you today.

## 2023-10-14 NOTE — ED Triage Notes (Signed)
 Patient come in POV, for complaint of coughing a lot and noticed her heart rate has increases. This morning noticed 140-90s. Rate. Stated only having shortness of breath with movement.
